# Patient Record
Sex: Female | Born: 1975 | Race: White | Hispanic: No | Marital: Married | Smoking: Never smoker
Health system: Southern US, Community
[De-identification: ages and names within clinical notes are randomized; demographics above are authoritative.]

## PROBLEM LIST (undated history)

## (undated) DIAGNOSIS — I10 Essential (primary) hypertension: Secondary | ICD-10-CM

## (undated) DIAGNOSIS — J302 Other seasonal allergic rhinitis: Secondary | ICD-10-CM

## (undated) DIAGNOSIS — G43909 Migraine, unspecified, not intractable, without status migrainosus: Secondary | ICD-10-CM

## (undated) DIAGNOSIS — N390 Urinary tract infection, site not specified: Secondary | ICD-10-CM

## (undated) DIAGNOSIS — Z8739 Personal history of other diseases of the musculoskeletal system and connective tissue: Secondary | ICD-10-CM

## (undated) HISTORY — PX: NO PAST SURGERIES: SHX2092

## (undated) HISTORY — PX: WISDOM TOOTH EXTRACTION: SHX21

## (undated) HISTORY — DX: Migraine, unspecified, not intractable, without status migrainosus: G43.909

## (undated) HISTORY — DX: Urinary tract infection, site not specified: N39.0

## (undated) HISTORY — DX: Essential (primary) hypertension: I10

## (undated) HISTORY — DX: Personal history of other diseases of the musculoskeletal system and connective tissue: Z87.39

---

## 2010-07-14 ENCOUNTER — Emergency Department: Payer: Self-pay | Admitting: Emergency Medicine

## 2013-03-23 ENCOUNTER — Ambulatory Visit (INDEPENDENT_AMBULATORY_CARE_PROVIDER_SITE_OTHER): Payer: BC Managed Care – PPO | Admitting: Adult Health

## 2013-03-23 ENCOUNTER — Telehealth: Payer: Self-pay | Admitting: Adult Health

## 2013-03-23 ENCOUNTER — Encounter: Payer: Self-pay | Admitting: Adult Health

## 2013-03-23 VITALS — BP 108/66 | HR 87 | Temp 98.0°F | Resp 12 | Ht 63.5 in | Wt 220.5 lb

## 2013-03-23 DIAGNOSIS — I499 Cardiac arrhythmia, unspecified: Secondary | ICD-10-CM | POA: Insufficient documentation

## 2013-03-23 LAB — CBC WITH DIFFERENTIAL/PLATELET
Basophils Absolute: 0 10*3/uL (ref 0.0–0.1)
Eosinophils Absolute: 0.1 10*3/uL (ref 0.0–0.7)
Eosinophils Relative: 1 % (ref 0–5)
Lymphocytes Relative: 18 % (ref 12–46)
MCH: 28.8 pg (ref 26.0–34.0)
MCV: 83.7 fL (ref 78.0–100.0)
Neutrophils Relative %: 76 % (ref 43–77)
Platelets: 327 10*3/uL (ref 150–400)
RDW: 14.5 % (ref 11.5–15.5)
WBC: 10.6 10*3/uL — ABNORMAL HIGH (ref 4.0–10.5)

## 2013-03-23 NOTE — Patient Instructions (Addendum)
  Please have your labs drawn prior to leaving.  We will notify you of results once available.  Our [facility] office will call you for the holter monitor.  Please call our office on Monday if you have not heard from them.

## 2013-03-23 NOTE — Progress Notes (Signed)
Subjective:    Patient ID: Kirsten Poole, female    DOB: 20-Apr-1976, 37 y.o.   MRN: [id number]  HPI  Kirsten Poole is a pleasant 37 y/o female who presents to clinic to establish care. She was previously followed by Yolanda Bonine, CNM. Pt will continue to see Melody for her GYN needs. Kirsten Poole is doing well overall. She is concerned because she has experienced recent episodes with extra heart beats. She noticed them on Monday in the morning and then again in the afternoon. On Tuesday she experienced possibly 8 episodes ~ every 30 mins. Occuring at the end of the day when she was busy trying to finish with the patients in the office. On Wednesday, first episode at 6:55 am, 9:25 am, 11:45 am, 3:20 pm, 3:57 pm, 4:10 pm and continued approximately every 30 min until about 5:30 pm. Then she had a few episodes during the night. On Thursday episodes were about every 1.5 hours to 2 hours and Friday (today) she has had 3 episodes today. There is no dizziness, lightheadedness, syncope or near syncope with any of these episodes. She drinks decaffeinated tea. There is no shortness of breath with any episode. Today she has felt a slight "pulling or tightness" in the chest on the left side above breast. She reports strenuous physical activity towards the end of last week with preparing for the installation of an above ground pool. She was shoveling and leveling the surface where the pool is being installed.   Past Medical History  Diagnosis Date  . Migraine    Surgical Hx: None  Family History  Problem Relation Age of Onset  . Diabetes Mother   . Hypertension Mother   . Thyroid disease Mother   . Thyroid disease Sister   . Thyroid disease Maternal Aunt   . Arthritis Maternal Grandmother   . Hyperlipidemia Maternal Grandmother   . Heart disease Maternal Grandmother   . Hypertension Maternal Grandmother   . Diabetes Maternal Grandmother   . Heart disease Maternal Grandfather     MI  . Hypertension Maternal  Grandfather   . Arthritis Paternal Grandmother      History   Social History  . Marital Status: Married    Spouse Name: [place] Garrelts    Number of Children: 3  . Years of Education: 14   Occupational History  . Office Manager     [employer]   Social History Main Topics  . Smoking status: Never Smoker   . Smokeless tobacco: Never Used  . Alcohol Use: No  . Drug Use: No  . Sexually Active: Yes    Birth Control/ Protection: IUD   Other Topics Concern  . Not on file   Social History Narrative   Kirsten Poole grew up in [place]. She lives at home with her husband of 15 years that their 3 children ([name], [name], [name]). They have a dog and cat. 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 She attended [employer] for her pre-requisites in nursing but realized this was not what she wanted to do. She is very busy with her 3 boys and baseball.       Review of Systems  Constitutional: Positive for fatigue. Negative for fever, chills and unexpected weight change.  HENT: Negative for ear pain, congestion, sore throat, neck pain, postnasal drip and sinus pressure.   Respiratory: Negative for wheezing.   Cardiovascular: Positive for palpitations.  Gastrointestinal: Positive for constipation. Negative for nausea, vomiting,  abdominal pain and blood in stool.  Genitourinary: Negative.   Musculoskeletal:       Slight discomfort with knees  Skin: Negative.   Allergic/Immunologic:       Season allergies - takes OTC medication.  Neurological: Positive for headaches. Negative for dizziness, tremors, seizures, syncope, weakness and light-headedness.  Hematological: Negative.   Psychiatric/Behavioral: Positive for decreased concentration. Negative for suicidal ideas, behavioral problems, confusion, sleep disturbance, self-injury and agitation. The patient is nervous/anxious.     BP 108/66  Pulse 87  Temp(Src) 98 F (36.7 C) (Oral)  Resp 12   Ht 5' 3.5" (1.613 m)  Wt 220 lb 8 oz (100.018 kg)  BMI 38.44 kg/m2  SpO2 98%    Objective:   Physical Exam  Constitutional: She is oriented to person, place, and time.  Overweight, pleasant female in NAD  HENT:  Head: Normocephalic and atraumatic.  Right Ear: External ear normal.  Left Ear: External ear normal.  Nose: Nose normal.  Mouth/Throat: Oropharynx is clear and moist.  Eyes: Conjunctivae and EOM are normal. Pupils are equal, round, and reactive to light.  Neck: Normal range of motion. Neck supple. No tracheal deviation present.  Cardiovascular: Normal rate, regular rhythm, normal heart sounds and intact distal pulses.  Exam reveals no gallop and no friction rub.   No murmur heard. Pulmonary/Chest: Effort normal and breath sounds normal. No respiratory distress. She has no wheezes. She has no rales.  Abdominal: Soft. Bowel sounds are normal. She exhibits no distension. There is no tenderness. There is no rebound and no guarding.  Musculoskeletal: Normal range of motion. She exhibits no edema and no tenderness.  Lymphadenopathy:    She has no cervical adenopathy.  Neurological: She is alert and oriented to person, place, and time. She has normal reflexes. No cranial nerve deficit. Coordination normal.  Skin: Skin is warm and dry.  Psychiatric: She has a normal mood and affect. Her behavior is normal. Judgment and thought content normal.          Assessment & Plan:

## 2013-03-23 NOTE — Telephone Encounter (Signed)
Left detailed mess on patients cell with all apt information for her holter monitor. [facility] in [place] ([address]) will place this on 7/14 @ 3pm. It will be removed at 3pm the very next day. I have provided the patient with the number to their office if this day does not work for her.

## 2013-03-23 NOTE — Assessment & Plan Note (Addendum)
Patient with frequent episodes of irregular heart beats. ?PVCs. Will check labs: cbc, tsh, bmet, magnesium.  ? Sleep apnea. Order holter monitor 24 hour. EKG today does not show incomplete RBBB as in 2011. No acute changes. If holter monitor wnl consider beta blocker for symptom control.

## 2013-03-24 LAB — BASIC METABOLIC PANEL
BUN: 13 mg/dL (ref 6–23)
Calcium: 9.1 mg/dL (ref 8.4–10.5)
Chloride: 104 mEq/L (ref 96–112)
Creat: 0.78 mg/dL (ref 0.50–1.10)

## 2013-03-24 LAB — TSH: TSH: 1.905 u[IU]/mL (ref 0.350–4.500)

## 2013-03-26 ENCOUNTER — Encounter (INDEPENDENT_AMBULATORY_CARE_PROVIDER_SITE_OTHER): Payer: BC Managed Care – PPO

## 2013-03-26 ENCOUNTER — Encounter: Payer: Self-pay | Admitting: Cardiology

## 2013-03-26 ENCOUNTER — Telehealth: Payer: Self-pay | Admitting: *Deleted

## 2013-03-26 DIAGNOSIS — R002 Palpitations: Secondary | ICD-10-CM

## 2013-03-26 NOTE — Telephone Encounter (Signed)
24 hr holter monitor placed on Pt 03/26/13 TK

## 2013-03-30 ENCOUNTER — Telehealth: Payer: Self-pay | Admitting: Adult Health

## 2013-03-30 NOTE — Telephone Encounter (Signed)
Holter monitor results

## 2013-03-30 NOTE — Telephone Encounter (Signed)
Left message for pt that I am in another office and Raquel is out of the office today, but that I would check for report on Monday and to call office if she has questions before then.

## 2013-04-02 ENCOUNTER — Telehealth: Payer: Self-pay | Admitting: Adult Health

## 2013-04-02 NOTE — Telephone Encounter (Signed)
Still having occasional symptoms, not worse than at office visit.

## 2013-04-02 NOTE — Telephone Encounter (Signed)
See other telephone note.  

## 2013-04-02 NOTE — Telephone Encounter (Signed)
No I have not seen the results yet. They are reviewed and interpreted outside of the office and it takes a while. We will call her once results are available. Is she still having symptoms?

## 2013-04-02 NOTE — Telephone Encounter (Signed)
Have you seen these results?

## 2013-04-02 NOTE — Telephone Encounter (Signed)
Asking for results of Holter monitor from last Monday.

## 2013-04-02 NOTE — Telephone Encounter (Signed)
Pt.notified

## 2013-04-09 ENCOUNTER — Telehealth: Payer: Self-pay | Admitting: Adult Health

## 2013-04-09 NOTE — Telephone Encounter (Signed)
Have you seen these results?

## 2013-04-09 NOTE — Telephone Encounter (Signed)
I have not seen these results. The strips are sent for evaluation. It takes a while to review. Once we get the results we will contact patient.

## 2013-04-09 NOTE — Telephone Encounter (Signed)
Patient wanting result from Holter monitor.

## 2013-04-10 NOTE — Telephone Encounter (Signed)
See my Chart message.

## 2013-04-17 ENCOUNTER — Encounter: Payer: Self-pay | Admitting: *Deleted

## 2013-04-17 ENCOUNTER — Telehealth: Payer: Self-pay | Admitting: Adult Health

## 2013-04-17 NOTE — Telephone Encounter (Signed)
Results are in Epic, under CV Procedures

## 2013-04-17 NOTE — Telephone Encounter (Signed)
I haven't seen these yet. Kriste Basque could you call the cardiology office and inquire about this?

## 2013-04-17 NOTE — Telephone Encounter (Signed)
Sent myChart message with results 

## 2013-04-17 NOTE — Telephone Encounter (Signed)
Pt calling to check on her results for her holter monitor.  Pt stated she had this done about 3 weeks ago @ [facility] in [place] Pt is getting anxious for results

## 2013-04-17 NOTE — Telephone Encounter (Signed)
Normal holter monitor. Please let her know that results were scanned into our system yesterday afternoon. These take a while to be read.

## 2013-07-19 ENCOUNTER — Other Ambulatory Visit: Payer: Self-pay

## 2013-08-30 ENCOUNTER — Ambulatory Visit: Payer: BC Managed Care – PPO | Admitting: Internal Medicine

## 2013-09-04 ENCOUNTER — Encounter: Payer: Self-pay | Admitting: Adult Health

## 2013-09-04 ENCOUNTER — Ambulatory Visit (INDEPENDENT_AMBULATORY_CARE_PROVIDER_SITE_OTHER): Payer: BC Managed Care – PPO | Admitting: Adult Health

## 2013-09-04 VITALS — BP 122/80 | HR 85 | Temp 98.3°F | Wt 226.0 lb

## 2013-09-04 DIAGNOSIS — J40 Bronchitis, not specified as acute or chronic: Secondary | ICD-10-CM | POA: Insufficient documentation

## 2013-09-04 MED ORDER — FLUTICASONE PROPIONATE 50 MCG/ACT NA SUSP: 2.0000 | Freq: Every day | NASAL | Status: DC

## 2013-09-04 MED ORDER — AZITHROMYCIN 250 MG PO TABS: ORAL_TABLET | ORAL | Status: DC

## 2013-09-04 NOTE — Patient Instructions (Signed)

## 2013-09-04 NOTE — Progress Notes (Signed)
   Subjective:    Patient ID: Kirsten Poole, female    DOB: 09-Feb-1976, 37 y.o.   MRN: [id number]  HPI  Kirsten Poole presents to clinic with c/o cough. She reports that approximately 4 weeks ago she started to have some sinus congestion, drainage. She denied every having a fever. Cough will sometimes wake her. She has not taken any OTC meds.   Current Outpatient Prescriptions on File Prior to Visit  Medication Sig Dispense Refill  . cyanocobalamin 500 MCG tablet Take 500 mcg by mouth daily.      . fish oil-omega-3 fatty acids 1000 MG capsule Take 1 g by mouth daily.      Marland Kitchen levonorgestrel (MIRENA) 20 MCG/24HR IUD 1 each by Intrauterine route once.       No current facility-administered medications on file prior to visit.     Review of Systems  Constitutional: Negative.   HENT: Positive for congestion, postnasal drip and sinus pressure.   Respiratory: Positive for cough. Negative for shortness of breath and wheezing.   Cardiovascular: Negative.   Neurological: Negative.   Psychiatric/Behavioral: Negative.        Objective:   Physical Exam  Constitutional: She is oriented to person, place, and time. No distress.  HENT:  Pharyngeal erythema. Drainage noted posterior pharynx.  Neck: No tracheal deviation present.  Cardiovascular: Normal rate, regular rhythm and normal heart sounds.   Pulmonary/Chest: Effort normal and breath sounds normal. No respiratory distress. She has no wheezes. She has no rales.  Lymphadenopathy:    She has no cervical adenopathy.  Neurological: She is alert and oriented to person, place, and time.  Skin: Skin is warm and dry.  Psychiatric: She has a normal mood and affect. Her behavior is normal. Judgment and thought content normal.   BP 122/80  Pulse 85  Temp(Src) 98.3 F (36.8 C) (Oral)  Wt 226 lb (102.513 kg)  SpO2 96%        Assessment & Plan:

## 2013-09-04 NOTE — Assessment & Plan Note (Signed)
Start Azithromycin. Flonase nasal spray. RTC if symptoms not improved within 4-5 days.

## 2013-09-04 NOTE — Progress Notes (Signed)
Pre visit review using our clinic review tool, if applicable. No additional management support is needed unless otherwise documented below in the visit note. 

## 2014-09-27 LAB — HM PAP SMEAR: HM Pap smear: NEGATIVE

## 2015-05-16 DIAGNOSIS — I8390 Asymptomatic varicose veins of unspecified lower extremity: Secondary | ICD-10-CM | POA: Insufficient documentation

## 2015-09-22 ENCOUNTER — Encounter: Payer: Self-pay | Admitting: *Deleted

## 2015-09-30 ENCOUNTER — Encounter: Payer: Self-pay | Admitting: Obstetrics and Gynecology

## 2015-12-23 ENCOUNTER — Encounter: Payer: Self-pay | Admitting: Obstetrics and Gynecology

## 2015-12-23 ENCOUNTER — Ambulatory Visit (INDEPENDENT_AMBULATORY_CARE_PROVIDER_SITE_OTHER): Payer: BC Managed Care – PPO | Admitting: Obstetrics and Gynecology

## 2015-12-23 VITALS — BP 112/82 | HR 97 | Ht 63.0 in | Wt 235.8 lb

## 2015-12-23 DIAGNOSIS — Z01419 Encounter for gynecological examination (general) (routine) without abnormal findings: Secondary | ICD-10-CM | POA: Diagnosis not present

## 2015-12-23 NOTE — Progress Notes (Signed)
Subjective:   Kirsten CedarsKelly B Petillo is a 40 y.o. No obstetric history on file. Caucasian female here for a routine well-woman exam.  No LMP recorded. Patient is not currently having periods (Reason: IUD).    Current complaints: desires restart weight loss meds, and needs IUD changed out. PCP: Rey       does desire labs  Social History: Sexual: heterosexual Marital Status: married Living situation: with family Occupation: unknown occupation Tobacco/alcohol: no tobacco use Illicit drugs: no history of illicit drug use  The following portions of the patient's history were reviewed and updated as appropriate: allergies, current medications, past family history, past medical history, past social history, past surgical history and problem list.  Past Medical History Past Medical History  Diagnosis Date  . Migraine   . UTI (lower urinary tract infection)     Past Surgical History History reviewed. No pertinent past surgical history.  Gynecologic History No obstetric history on file.  No LMP recorded. Patient is not currently having periods (Reason: IUD). Contraception: IUD Last Pap: 2015. Results were: normal   Obstetric History OB History  No data available    Current Medications Current Outpatient Prescriptions on File Prior to Visit  Medication Sig Dispense Refill  . azithromycin (ZITHROMAX) 250 MG tablet Take 2 tablets today then take 1 daily for the next 4 days. 6 tablet 0  . fluticasone (FLONASE) 50 MCG/ACT nasal spray Place 2 sprays into both nostrils daily. 16 g 6  . levonorgestrel (MIRENA) 20 MCG/24HR IUD 1 each by Intrauterine route once.    . cyanocobalamin 500 MCG tablet Take 500 mcg by mouth daily. Reported on 12/23/2015    . fish oil-omega-3 fatty acids 1000 MG capsule Take 1 g by mouth daily. Reported on 12/23/2015     No current facility-administered medications on file prior to visit.    Review of Systems Patient denies any headaches, blurred vision, shortness  of breath, chest pain, abdominal pain, problems with bowel movements, urination, or intercourse.  Objective:  BP 112/82 mmHg  Pulse 97  Ht 5\' 3"  (1.6 m)  Wt 235 lb 12.8 oz (106.958 kg)  BMI 41.78 kg/m2 Physical Exam  General:  Well developed, well nourished, no acute distress. She is alert and oriented x3. Skin:  Warm and dry Neck:  Midline trachea, no thyromegaly or nodules Cardiovascular: Regular rate and rhythm, no murmur heard Lungs:  Effort normal, all lung fields clear to auscultation bilaterally Breasts:  No dominant palpable mass, retraction, or nipple discharge Abdomen:  Soft, non tender, no hepatosplenomegaly or masses Pelvic:  External genitalia is normal in appearance.  The vagina is normal in appearance. The cervix is bulbous, no CMT. IUD string palpated. Thin prep pap is not done . Uterus is felt to be normal size, shape, and contour.  No adnexal masses or tenderness noted. Extremities:  No swelling or varicosities noted Psych:  She has a normal mood and affect  Assessment:   Healthy well-woman exam Morbid obesity Mirena use  Plan:  Return in 2 days to change out IUD and start weight loss plan F/U 1 year for AE, or sooner if needed   Melody Suzan NailerN Shambley, CNM

## 2015-12-23 NOTE — Patient Instructions (Signed)
Place annual gynecologic exam patient instructions here.

## 2015-12-24 LAB — COMPREHENSIVE METABOLIC PANEL
ALBUMIN: 4.1 g/dL (ref 3.5–5.5)
ALT: 12 IU/L (ref 0–32)
AST: 12 IU/L (ref 0–40)
Albumin/Globulin Ratio: 1.6 (ref 1.2–2.2)
Alkaline Phosphatase: 71 IU/L (ref 39–117)
BILIRUBIN TOTAL: 0.3 mg/dL (ref 0.0–1.2)
BUN / CREAT RATIO: 18 (ref 9–23)
BUN: 14 mg/dL (ref 6–20)
CO2: 18 mmol/L (ref 18–29)
CREATININE: 0.78 mg/dL (ref 0.57–1.00)
Calcium: 9.5 mg/dL (ref 8.7–10.2)
Chloride: 102 mmol/L (ref 96–106)
GFR, EST AFRICAN AMERICAN: 112 mL/min/{1.73_m2} (ref >59)
GFR, EST NON AFRICAN AMERICAN: 97 mL/min/{1.73_m2} (ref >59)
GLUCOSE: 101 mg/dL — AB (ref 65–99)
Globulin, Total: 2.6 g/dL (ref 1.5–4.5)
Potassium: 4.4 mmol/L (ref 3.5–5.2)
Sodium: 138 mmol/L (ref 134–144)
TOTAL PROTEIN: 6.7 g/dL (ref 6.0–8.5)

## 2015-12-24 LAB — LIPID PANEL
CHOL/HDL RATIO: 3.5 ratio (ref 0.0–4.4)
Cholesterol, Total: 149 mg/dL (ref 100–199)
HDL: 43 mg/dL (ref >39)
LDL CALC: 81 mg/dL (ref 0–99)
Triglycerides: 124 mg/dL (ref 0–149)
VLDL CHOLESTEROL CAL: 25 mg/dL (ref 5–40)

## 2015-12-24 LAB — THYROID PANEL WITH TSH
Free Thyroxine Index: 1.7 (ref 1.2–4.9)
T3 Uptake Ratio: 25 % (ref 24–39)
T4, Total: 6.7 ug/dL (ref 4.5–12.0)
TSH: 3.24 u[IU]/mL (ref 0.450–4.500)

## 2015-12-24 LAB — HEMOGLOBIN A1C
ESTIMATED AVERAGE GLUCOSE: 114 mg/dL
HEMOGLOBIN A1C: 5.6 % (ref 4.8–5.6)

## 2015-12-25 ENCOUNTER — Encounter: Payer: Self-pay | Admitting: Obstetrics and Gynecology

## 2015-12-25 ENCOUNTER — Ambulatory Visit (INDEPENDENT_AMBULATORY_CARE_PROVIDER_SITE_OTHER): Payer: BC Managed Care – PPO | Admitting: Obstetrics and Gynecology

## 2015-12-25 VITALS — BP 120/78 | HR 98 | Ht 63.0 in | Wt 236.0 lb

## 2015-12-25 DIAGNOSIS — E669 Obesity, unspecified: Secondary | ICD-10-CM | POA: Diagnosis not present

## 2015-12-25 DIAGNOSIS — Z30433 Encounter for removal and reinsertion of intrauterine contraceptive device: Secondary | ICD-10-CM | POA: Diagnosis not present

## 2015-12-25 MED ORDER — CYANOCOBALAMIN 1000 MCG/ML IJ SOLN: 1000.0000 ug | Freq: Once | INTRAMUSCULAR | Status: DC

## 2015-12-25 MED ORDER — PHENTERMINE HCL 37.5 MG PO TABS: 37.5000 mg | ORAL_TABLET | Freq: Every day | ORAL | Status: DC

## 2015-12-25 NOTE — Patient Instructions (Signed)
Thank you for enrolling in MyChart. Please follow the instructions below to securely access your online medical record. MyChart allows you to send messages to your doctor, view your test results, renew your prescriptions, schedule appointments, and more.  How Do I Sign Up? 1. In your Internet browser, go to [email or web address]. 2. Click on the New  User? link in the Sign In box.  3. Enter your MyChart Access Code exactly as it appears below. You will not need to use this code after you have completed the sign-up process. If you do not sign up before the expiration date, you must request a new code. MyChart Access Code: Activation code not generated Current MyChart Status: Active  4. Enter the last four digits of your Social Security Number (xxxx) and Date of Birth (mm/dd/yyyy) as indicated and click Next. You will be taken to the next sign-up page. 5. Create a MyChart ID. This will be your MyChart login ID and cannot be changed, so think of one that is secure and easy to remember. 6. Create a MyChart password. You can change your password at any time. 7. Enter your Password Reset Question and Answer and click Next. This can be used at a later time if you forget your password.  8. Select your communication preference, and if applicable enter your e-mail address. You will receive e-mail notification when new information is available in MyChart by choosing to receive e-mail notifications and filling in your e-mail. 9. Click Sign In. You can now view your medical record.   Additional Information If you have questions, you can email [email or web address] or call [phone] to talk to our MyChart staff. Remember, MyChart is NOT to be used for urgent needs. For medical emergencies, dial 911. IUD PLACEMENT POST-PROCEDURE INSTRUCTIONS  1. You may take Ibuprofen, Aleve or Tylenol for pain if needed.  Cramping should resolve within in 24 hours.  2. You may have a small  amount of spotting.  You should wear a mini pad for the next few days.  3. You may have intercourse after 24 hours.  If you using this for birth control, it is effective immediately.  4. You need to call if you have any pelvic pain, fever, heavy bleeding or foul smelling vaginal discharge.  Irregular bleeding is common the first several months after having an IUD placed. You do not need to call for this reason unless you are concerned.  5. Shower or bathe as normal

## 2015-12-25 NOTE — Progress Notes (Signed)
Kirsten Poole is a 40 y.o. year old 773P0 Caucasian female who presents for removal and replacement of a Mirena IUD. She was given informed consent for removal and reinsertion of her Mirena. Her Mirena was placed 2011, No LMP recorded. Patient is not currently having periods (Reason: IUD)., and her pregnancy test today was negative.   The risks and benefits of the method and placement have been thouroughly reviewed with the patient and all questions were answered.  Specifically the patient is aware of failure rate of 09/998, expulsion of the IUD and of possible perforation.  The patient is aware of irregular bleeding due to the method and understands the incidence of irregular bleeding diminishes with time.  Signed copy of informed consent in chart.   No LMP recorded. Patient is not currently having periods (Reason: IUD). BP 120/78 mmHg  Pulse 98  Ht 5\' 3"  (1.6 m)  Wt 236 lb (107.049 kg)  BMI 41.82 kg/m2 No results found for this or any previous visit (from the past 24 hour(s)).   Appropriate time out taken. A graves speculum was placed in the vagina.  The cervix was visualized, prepped using Betadine. The strings were visible. They were grasped and the Mirena was easily removed. The cervix was then grasped with a single-tooth tenaculum. The uterus was found to be neutral and it sounded to 9 cm.  Mirena IUD placed per manufacturer's recommendations without complications. The strings were trimmed to 3 cm.  The patient tolerated the procedure well.  B12 1000mcg injection was given to start weight loss program along with RX for adipex  The patient was given post procedure instructions, including signs and symptoms of infection and to check for the strings after each menses or each month, and refraining from intercourse or anything in the vagina for 3 days.  She was given a Mirena care card with date Mirena placed, and date Mirena to be removed.  RTC 4 weeks for wt/bp/b12 as discussed.  Melody Suzan NailerN  Shambley, CNM

## 2016-01-23 ENCOUNTER — Ambulatory Visit (INDEPENDENT_AMBULATORY_CARE_PROVIDER_SITE_OTHER): Payer: BC Managed Care – PPO | Admitting: Obstetrics and Gynecology

## 2016-01-23 VITALS — BP 115/87 | HR 73 | Wt 233.0 lb

## 2016-01-23 DIAGNOSIS — E669 Obesity, unspecified: Secondary | ICD-10-CM

## 2016-01-23 MED ORDER — CYANOCOBALAMIN 1000 MCG/ML IJ SOLN
1000.0000 ug | Freq: Once | INTRAMUSCULAR | Status: AC
  Administered 2016-01-23: 1000 ug via INTRAMUSCULAR

## 2016-01-23 NOTE — Progress Notes (Signed)
Patient ID: Kirsten CedarsKelly B Bolding, female   DOB: 01/05/1976, 40 y.o.   MRN: [id number] Pt presents for weight, B/P, B-12 injection. No side effects of medication-Phentermine, or B-12.  Weight loss of  3  lbs. Encouraged eating healthy and exercise.

## 2016-03-02 ENCOUNTER — Encounter: Payer: Self-pay | Admitting: Obstetrics and Gynecology

## 2016-06-03 ENCOUNTER — Ambulatory Visit (INDEPENDENT_AMBULATORY_CARE_PROVIDER_SITE_OTHER): Payer: BC Managed Care – PPO | Admitting: Obstetrics and Gynecology

## 2016-06-03 ENCOUNTER — Encounter: Payer: Self-pay | Admitting: Obstetrics and Gynecology

## 2016-06-03 VITALS — BP 118/86 | HR 84 | Ht 63.0 in | Wt 223.1 lb

## 2016-06-03 DIAGNOSIS — E669 Obesity, unspecified: Secondary | ICD-10-CM | POA: Diagnosis not present

## 2016-06-03 DIAGNOSIS — Z79899 Other long term (current) drug therapy: Secondary | ICD-10-CM

## 2016-06-03 MED ORDER — PHENTERMINE HCL 37.5 MG PO TABS: 37.5000 mg | ORAL_TABLET | Freq: Every day | ORAL | 2 refills | Status: DC

## 2016-06-03 MED ORDER — CYANOCOBALAMIN 1000 MCG/ML IJ SOLN: 1000.0000 ug | INTRAMUSCULAR | 1 refills | Status: DC

## 2016-06-03 NOTE — Progress Notes (Signed)
SUBJECTIVE:  40 y.o. here for follow-up weight loss visit, previously seen 4 weeks ago. Denies any concerns and feels like medication works well. Has been off of the medication for 3 weeks. Has lost 12# total. Is walking 20 minutes three times a week. Desires another 3 months of medication to continue losing weight.  OBJECTIVE:  BP 118/86   Pulse 84   Ht 5\' 3"  (1.6 m)   Wt 223 lb 1.6 oz (101.2 kg)   BMI 39.52 kg/m   Body mass index is 39.52 kg/m. Patient appears well.  ASSESSMENT:  Obesity- responding well to weight loss plan PLAN:  To continue with current medications. B12 102400mcg/ml injection given. Encouraged to increase exercise to 4-5 days and walking longer.  RTC in 14 weeks as planned  Coner Gibbard South[place]Shambley, CNM

## 2016-09-09 ENCOUNTER — Ambulatory Visit: Payer: BC Managed Care – PPO | Admitting: Obstetrics and Gynecology

## 2017-10-19 ENCOUNTER — Encounter: Payer: Self-pay | Admitting: Emergency Medicine

## 2017-10-19 ENCOUNTER — Ambulatory Visit
Admission: EM | Admit: 2017-10-19 | Discharge: 2017-10-19 | Disposition: A | Discharge status: Home / Self Care | Payer: BC Managed Care – PPO | Attending: Family Medicine | Admitting: Family Medicine

## 2017-10-19 ENCOUNTER — Other Ambulatory Visit: Payer: Self-pay

## 2017-10-19 DIAGNOSIS — H5710 Ocular pain, unspecified eye: Secondary | ICD-10-CM

## 2017-10-19 DIAGNOSIS — H5702 Anisocoria: Secondary | ICD-10-CM

## 2017-10-19 NOTE — Discharge Instructions (Signed)
Recommend patient go to her Eye doctor today for further evaluation of acute eye changes (pain, redness, change in pupil size)

## 2017-10-19 NOTE — ED Triage Notes (Signed)
Patient c/o watery drainage and sensitivity to light that started yesterday.  Patient states that she does wear contacts.

## 2017-10-19 NOTE — ED Provider Notes (Signed)
[facility]    CSN: [id number] Arrival date & time: 10/19/17  4098     History   Chief Complaint Chief Complaint  Patient presents with  . Eye Problem    HPI Kirsten Poole is a 42 y.o. female.   42 yo female with a c/o right eye pain, redness, and watery since yesterday. States she's been wearing monthly contact lenses prescribed last month and keeps them in overnight. Denies any thick, yellow drainage from the eye, direct traumatic injury, vision changes. States she's not aware of any differences in her pupils. (states has never been told her pupils are different size).    The history is provided by the patient.  Eye Problem    Past Medical History:  Diagnosis Date  . Migraine   . UTI (lower urinary tract infection)     Patient Active Problem List   Diagnosis Date Noted  . Bronchitis 09/04/2013  . Irregular heart beats 03/23/2013    History reviewed. No pertinent surgical history.  OB History    Gravida Para Term Preterm AB Living   3         3   SAB TAB Ectopic Multiple Live Births           3       Home Medications    Prior to Admission medications   Medication Sig Start Date End Date Taking? Authorizing Provider  fish oil-omega-3 fatty acids 1000 MG capsule Take 1 g by mouth daily. Reported on 12/23/2015   Yes [provider]  levonorgestrel (MIRENA) 20 MCG/24HR IUD 1 each by Intrauterine route once.   Yes [provider]  azithromycin (ZITHROMAX) 250 MG tablet Take 2 tablets today then take 1 daily for the next 4 days. Patient not taking: Reported on 06/03/2016 09/04/13   Novella Olive, NP  cetirizine (ZYRTEC ALLERGY) 10 MG tablet Take 10 mg by mouth daily.    [provider]  cyanocobalamin (,VITAMIN B-12,) 1000 MCG/ML injection Inject 1 mL (1,000 mcg total) into the muscle every 30 (thirty) days. 06/03/16   Shambley, Melody N, CNM  cyanocobalamin 500 MCG tablet Take 500 mcg by mouth daily. Reported on 12/23/2015     [provider]  fluticasone (FLONASE) 50 MCG/ACT nasal spray Place 2 sprays into both nostrils daily. 09/04/13   Rey, Richarda Overlie, NP  phentermine (ADIPEX-P) 37.5 MG tablet Take 1 tablet (37.5 mg total) by mouth daily before breakfast. 06/03/16   Aura Camps, Melody N, CNM    Family History Family History  Problem Relation Age of Onset  . Diabetes Mother   . Hypertension Mother   . Thyroid disease Mother   . Thyroid disease Sister   . Thyroid disease Maternal Aunt   . Arthritis Maternal Grandmother   . Hyperlipidemia Maternal Grandmother   . Heart disease Maternal Grandmother   . Hypertension Maternal Grandmother   . Diabetes Maternal Grandmother   . Heart disease Maternal Grandfather        MI  . Hypertension Maternal Grandfather   . Arthritis Paternal Grandmother     Social History Social History   Tobacco Use  . Smoking status: Never Smoker  . Smokeless tobacco: Never Used  Substance Use Topics  . Alcohol use: No  . Drug use: No     Allergies   Other   Review of Systems Review of Systems   Physical Exam Triage Vital Signs ED Triage Vitals  Enc Vitals Group     BP  10/19/17 0820 135/84     Pulse Rate 10/19/17 0820 74     Resp 10/19/17 0820 16     Temp 10/19/17 0820 97.9 F (36.6 C)     Temp Source 10/19/17 0820 Oral     SpO2 10/19/17 0820 99 %     Weight 10/19/17 0817 235 lb (106.6 kg)     Height 10/19/17 0817 5\' 3"  (1.6 m)     Head Circumference --      Peak Flow --      Pain Score 10/19/17 0817 0     Pain Loc --      Pain Edu? --      Excl. in GC? --    No data found.  Updated Vital Signs BP 135/84 (BP Location: Left Arm)   Pulse 74   Temp 97.9 F (36.6 C) (Oral)   Resp 16   Ht 5\' 3"  (1.6 m)   Wt 235 lb (106.6 kg)   SpO2 99%   BMI 41.63 kg/m   Visual Acuity Right Eye Distance: 20/100 uncorrected Left Eye Distance: 20/100 uncorrected Bilateral Distance:    Right Eye Near:   Left Eye Near:    Bilateral Near:     Physical Exam   Constitutional: She appears well-developed and well-nourished. No distress.  Eyes: EOM are normal. Right conjunctiva is injected. Pupils are unequal (right pupil smaller than left).  Skin: She is not diaphoretic.  Nursing note and vitals reviewed.    UC Treatments / Results  Labs (all labs ordered are listed, but only abnormal results are displayed) Labs Reviewed - No data to display  EKG  EKG Interpretation None       Radiology No results found.  Procedures Procedures (including critical care time)  Medications Ordered in UC Medications - No data to display   Initial Impression / Assessment and Plan / UC Course  I have reviewed the triage vital signs and the nursing notes.  Pertinent labs & imaging results that were available during my care of the patient were reviewed by me and considered in my medical decision making (see chart for details).       Final Clinical Impressions(s) / UC Diagnoses   Final diagnoses:  Acute eye pain  Unequal pupils    ED Discharge Orders    None     1. Possible diagnosis reviewed with patient; recommend patient be seen by her eye doctor today for further evaluation and management. Patient will proceed to [facility] where she's a patient and was seen last one month ago.   Controlled Substance Prescriptions [place] Controlled Substance Registry consulted? Not Applicable   Payton Mccallumonty, Vaibhav Fogleman, MD 10/19/17 [phone]

## 2018-03-01 ENCOUNTER — Other Ambulatory Visit: Payer: Self-pay | Admitting: Orthopedic Surgery

## 2018-03-01 DIAGNOSIS — M25561 Pain in right knee: Secondary | ICD-10-CM

## 2018-04-07 ENCOUNTER — Ambulatory Visit
Admission: RE | Admit: 2018-04-07 | Discharge: 2018-04-07 | Disposition: A | Discharge status: Home / Self Care | Payer: BC Managed Care – PPO | Source: Ambulatory Visit | Attending: Orthopedic Surgery | Admitting: Orthopedic Surgery

## 2018-04-07 DIAGNOSIS — M25561 Pain in right knee: Secondary | ICD-10-CM | POA: Diagnosis not present

## 2018-04-07 DIAGNOSIS — M1711 Unilateral primary osteoarthritis, right knee: Secondary | ICD-10-CM | POA: Diagnosis not present

## 2020-10-19 ENCOUNTER — Other Ambulatory Visit: Payer: Self-pay

## 2020-10-19 ENCOUNTER — Ambulatory Visit (INDEPENDENT_AMBULATORY_CARE_PROVIDER_SITE_OTHER): Payer: BC Managed Care – PPO

## 2020-10-19 ENCOUNTER — Ambulatory Visit
Admission: RE | Admit: 2020-10-19 | Discharge: 2020-10-19 | Disposition: A | Discharge status: Home / Self Care | Payer: BC Managed Care – PPO | Source: Ambulatory Visit | Attending: Sports Medicine | Admitting: Sports Medicine

## 2020-10-19 VITALS — BP 141/82 | HR 88 | Temp 97.8°F | Resp 18 | Ht 63.0 in | Wt 230.0 lb

## 2020-10-19 DIAGNOSIS — R630 Anorexia: Secondary | ICD-10-CM | POA: Diagnosis not present

## 2020-10-19 DIAGNOSIS — R109 Unspecified abdominal pain: Secondary | ICD-10-CM | POA: Diagnosis not present

## 2020-10-19 DIAGNOSIS — R1030 Lower abdominal pain, unspecified: Secondary | ICD-10-CM | POA: Insufficient documentation

## 2020-10-19 DIAGNOSIS — R197 Diarrhea, unspecified: Secondary | ICD-10-CM | POA: Insufficient documentation

## 2020-10-19 DIAGNOSIS — K921 Melena: Secondary | ICD-10-CM | POA: Insufficient documentation

## 2020-10-19 DIAGNOSIS — Z20822 Contact with and (suspected) exposure to covid-19: Secondary | ICD-10-CM | POA: Diagnosis not present

## 2020-10-19 HISTORY — DX: Other seasonal allergic rhinitis: J30.2

## 2020-10-19 LAB — CBC WITH DIFFERENTIAL/PLATELET
Abs Immature Granulocytes: 0.03 10*3/uL (ref 0.00–0.07)
Basophils Absolute: 0 10*3/uL (ref 0.0–0.1)
Basophils Relative: 0 %
Eosinophils Absolute: 0.2 10*3/uL (ref 0.0–0.5)
Eosinophils Relative: 2 %
HCT: 41.3 % (ref 36.0–46.0)
Hemoglobin: 13.7 g/dL (ref 12.0–15.0)
Immature Granulocytes: 0 %
Lymphocytes Relative: 16 %
Lymphs Abs: 1.6 10*3/uL (ref 0.7–4.0)
MCH: 28.2 pg (ref 26.0–34.0)
MCHC: 33.2 g/dL (ref 30.0–36.0)
MCV: 85.2 fL (ref 80.0–100.0)
Monocytes Absolute: 0.5 10*3/uL (ref 0.1–1.0)
Monocytes Relative: 5 %
Neutro Abs: 7.6 10*3/uL (ref 1.7–7.7)
Neutrophils Relative %: 77 %
Platelets: 373 10*3/uL (ref 150–400)
RBC: 4.85 MIL/uL (ref 3.87–5.11)
RDW: 13.6 % (ref 11.5–15.5)
WBC: 10 10*3/uL (ref 4.0–10.5)
nRBC: 0 % (ref 0.0–0.2)

## 2020-10-19 LAB — COMPREHENSIVE METABOLIC PANEL
ALT: 15 U/L (ref 0–44)
AST: 17 U/L (ref 15–41)
Albumin: 3.8 g/dL (ref 3.5–5.0)
Alkaline Phosphatase: 59 U/L (ref 38–126)
Anion gap: 12 (ref 5–15)
BUN: 9 mg/dL (ref 6–20)
CO2: 23 mmol/L (ref 22–32)
Calcium: 8.9 mg/dL (ref 8.9–10.3)
Chloride: 104 mmol/L (ref 98–111)
Creatinine, Ser: 0.81 mg/dL (ref 0.44–1.00)
GFR, Estimated: >60 mL/min (ref >60)
Glucose, Bld: 98 mg/dL (ref 70–99)
Potassium: 3.3 mmol/L — ABNORMAL LOW (ref 3.5–5.1)
Sodium: 139 mmol/L (ref 135–145)
Total Bilirubin: 0.7 mg/dL (ref 0.3–1.2)
Total Protein: 7.4 g/dL (ref 6.5–8.1)

## 2020-10-19 LAB — URINALYSIS, COMPLETE (UACMP) WITH MICROSCOPIC
Glucose, UA: NEGATIVE mg/dL
Ketones, ur: >160 mg/dL — AB
Leukocytes,Ua: NEGATIVE
Nitrite: NEGATIVE
Protein, ur: 30 mg/dL — AB
Specific Gravity, Urine: >1.03 — ABNORMAL HIGH (ref 1.005–1.030)
pH: 5.5 (ref 5.0–8.0)

## 2020-10-19 LAB — LIPASE, BLOOD: Lipase: 21 U/L (ref 11–51)

## 2020-10-19 MED ORDER — KETOROLAC TROMETHAMINE 10 MG PO TABS: 10.0000 mg | ORAL_TABLET | Freq: Four times a day (QID) | ORAL | 0 refills | Status: DC | PRN

## 2020-10-19 MED ORDER — KETOROLAC TROMETHAMINE 30 MG/ML IJ SOLN
30.0000 mg | Freq: Once | INTRAMUSCULAR | Status: AC
  Administered 2020-10-19: 30 mg via INTRAVENOUS

## 2020-10-19 MED ORDER — SODIUM CHLORIDE 0.9 % IV BOLUS
1000.0000 mL | Freq: Once | INTRAVENOUS | Status: AC
  Administered 2020-10-19: 1000 mL via INTRAVENOUS

## 2020-10-19 MED ORDER — ONDANSETRON 8 MG PO TBDP: 8.0000 mg | ORAL_TABLET | Freq: Once | ORAL | Status: DC

## 2020-10-19 MED ORDER — KETOROLAC TROMETHAMINE 10 MG PO TABS: 10.0000 mg | ORAL_TABLET | Freq: Four times a day (QID) | ORAL | 0 refills | Status: AC | PRN

## 2020-10-19 MED ORDER — KETOROLAC TROMETHAMINE 60 MG/2ML IM SOLN: 60.0000 mg | Freq: Once | INTRAMUSCULAR | Status: DC

## 2020-10-19 NOTE — ED Triage Notes (Addendum)
Patient in today c/o abdominal pain and diarrhea x 4 days, getting worse. Patient denies fever. Patient has taken OTC Imodium (1 dose per day) without relief. Patient has not had the covid vaccines. Patient declines covid testing at this time.

## 2020-10-19 NOTE — Discharge Instructions (Addendum)
Please return with stool samples as soon as possible. We close at 4 today but are open 8-8 tomorrow. It is possible you could have C-diff. If you do we will need to treat with antibiotics. Right now, increase rest and fluids. Go to ED for worsening symptoms.  ABDOMINAL PAIN: You may take Tylenol for pain relief. Use medications as directed including antiemetics and antidiarrheal medications if suggested or prescribed. You should increase fluids and electrolytes as well as rest over these next several days. If you have any questions or concerns, or if your symptoms are not improving or if especially if they acutely worsen, please call or stop back to the clinic immediately and we will be happy to help you or go to the ER   ABDOMINAL PAIN RED FLAGS: Seek immediate further care if: symptoms remain the same or worsen over the next 3-7 days, you are unable to keep fluids down, you see blood or mucus in your stool, you vomit black or dark red material, you have a fever of 101.F or higher, you have localized and/or persistent abdominal pain    You have received COVID testing today either for positive exposure, concerning symptoms that could be related to COVID infection, screening purposes, or re-testing after confirmed positive.  Your test obtained today checks for active viral infection in the last 1-2 weeks. If your test is negative now, you can still test positive later. So, if you do develop symptoms you should either get re-tested and/or isolate x 5 days and then strict mask use x 5 days (unvaccinated) or mask use x 10 days (vaccinated). Please follow CDC guidelines.  While Rapid antigen tests come back in 15-20 minutes, send out PCR/molecular test results typically come back within 1-3 days. In the mean time, if you are symptomatic, assume this could be a positive test and treat/monitor yourself as if you do have COVID.   We will call with test results if positive. Please download the MyChart app and set  up a profile to access test results.   If symptomatic, go home and rest. Push fluids. Take Tylenol as needed for discomfort. Gargle warm salt water. Throat lozenges. Take Mucinex DM or Robitussin for cough. Humidifier in bedroom to ease coughing. Warm showers. Also review the COVID handout for more information.  COVID-19 INFECTION: The incubation period of COVID-19 is approximately 14 days after exposure, with most symptoms developing in roughly 4-5 days. Symptoms may range in severity from mild to critically severe. Roughly 80% of those infected will have mild symptoms. People of any age may become infected with COVID-19 and have the ability to transmit the virus. The most common symptoms include: fever, fatigue, cough, body aches, headaches, sore throat, nasal congestion, shortness of breath, nausea, vomiting, diarrhea, changes in smell and/or taste.    COURSE OF ILLNESS Some patients may begin with mild disease which can progress quickly into critical symptoms. If your symptoms are worsening please call ahead to the Emergency Department and proceed there for further treatment. Recovery time appears to be roughly 1-2 weeks for mild symptoms and 3-6 weeks for severe disease.   GO IMMEDIATELY TO ER FOR FEVER YOU ARE UNABLE TO GET DOWN WITH TYLENOL, BREATHING PROBLEMS, CHEST PAIN, FATIGUE, LETHARGY, INABILITY TO EAT OR DRINK, ETC  QUARANTINE AND ISOLATION: To help decrease the spread of COVID-19 please remain isolated if you have COVID infection or are highly suspected to have COVID infection. This means -stay home and isolate to one room in the  home if you live with others. Do not share a bed or bathroom with others while ill, sanitize and wipe down all countertops and keep common areas clean and disinfected. Stay home for 5 days. If you have no symptoms or your symptoms are resolving after 5 days, you can leave your house. Continue to wear a mask around others for 5 additional days. If you have been in  close contact (within 6 feet) of someone diagnosed with COVID 19, you are advised to quarantine in your home for 14 days as symptoms can develop anywhere from 2-14 days after exposure to the virus. If you develop symptoms, you  must isolate.  Most current guidelines for COVID after exposure -unvaccinated: isolate 5 days and strict mask use x 5 days. Test on day 5 is possible -vaccinated: wear mask x 10 days if symptoms do not develop -You do not necessarily need to be tested for COVID if you have + exposure and  develop symptoms. Just isolate at home x10 days from symptom onset During this global pandemic, CDC advises to practice social distancing, try to stay at least 25ft away from others at all times. Wear a face covering. Wash and sanitize your hands regularly and avoid going anywhere that is not necessary.  KEEP IN MIND THAT THE COVID TEST IS NOT 100% ACCURATE AND YOU SHOULD STILL DO EVERYTHING TO PREVENT POTENTIAL SPREAD OF VIRUS TO OTHERS (WEAR MASK, WEAR GLOVES, WASH HANDS AND SANITIZE REGULARLY). IF INITIAL TEST IS NEGATIVE, THIS MAY NOT MEAN YOU ARE DEFINITELY NEGATIVE. MOST ACCURATE TESTING IS DONE 5-7 DAYS AFTER EXPOSURE.   It is not advised by CDC to get re-tested after receiving a positive COVID test since you can still test positive for weeks to months after you have already cleared the virus.   *If you have not been vaccinated for COVID, I strongly suggest you consider getting vaccinated as long as there are no contraindications.

## 2020-10-19 NOTE — ED Provider Notes (Signed)
[facility]    CSN: [id number] Arrival date & time: 10/19/20  1046      History   Chief Complaint Chief Complaint  Patient presents with  . Diarrhea  . Abdominal Pain    HPI Kirsten Poole is a 45 y.o. female presenting for approximately 4-day history of umbilical abdominal pain/cramping that is intermittent.  Patient states that this happens multiple times per day and the pain will radiate across the lower abdomen.  This is associated with "constant diarrhea."  She says that the stools are yellowish and watery.  She also admits to decreased appetite.  Patient states that anytime she eats or drinks anything she will have the pain in her stomach.  She denies any nausea or vomiting.  No fever.  Denies any urinary symptoms.  No vaginal discharge.  No back pain.  No blood in the urine.  Has noticed any blood in the stools or dark stools.  Denies any fatigue, weakness, cough, congestion or sore throat.  No known COVID-19 exposure.  She has used Imodium over-the-counter but only taken about 1 pill/day.  She says that she does not think it really help so she is not taking it too much.  She has not taken anything for pain relief.  No history of GI problems.  Never had any similar problems in the past.  Patient is never had any surgeries.  Patient not concerned for pregnancy.  Has an IUD.  She does admit to taking 2 days of Clindamycin for facial abscess about a week ago. No recent travel. She has no other complaints or concerns today.  HPI  Past Medical History:  Diagnosis Date  . Migraine   . Seasonal allergies   . UTI (lower urinary tract infection)     Patient Active Problem List   Diagnosis Date Noted  . Bronchitis 09/04/2013  . Irregular heart beats 03/23/2013    Past Surgical History:  Procedure Laterality Date  . NO PAST SURGERIES      OB History    Gravida  3   Para      Term      Preterm      AB      Living  3     SAB      IAB      Ectopic       Multiple      Live Births  3            Home Medications    Prior to Admission medications   Medication Sig Start Date End Date Taking? Authorizing Provider  cetirizine (ZYRTEC) 10 MG tablet Take 10 mg by mouth daily.   Yes [provider]  levonorgestrel (MIRENA) 20 MCG/24HR IUD 1 each by Intrauterine route once.   Yes [provider]  cyanocobalamin (,VITAMIN B-12,) 1000 MCG/ML injection Inject 1 mL (1,000 mcg total) into the muscle every 30 (thirty) days. 06/03/16   Shambley, Melody N, CNM  cyanocobalamin 500 MCG tablet Take 500 mcg by mouth daily. Reported on 12/23/2015    [provider]  fish oil-omega-3 fatty acids 1000 MG capsule Take 1 g by mouth daily. Reported on 12/23/2015    [provider]  fluticasone (FLONASE) 50 MCG/ACT nasal spray Place 2 sprays into both nostrils daily. 09/04/13   Rey, Richarda Overlie, NP  ketorolac (TORADOL) 10 MG tablet Take 1 tablet (10 mg total) by mouth every 6 (six) hours as needed for up to 5 days. 10/19/20  10/24/20  Shirlee Latch, PA-C  phentermine (ADIPEX-P) 37.5 MG tablet Take 1 tablet (37.5 mg total) by mouth daily before breakfast. 06/03/16   Aura Camps, Melody N, CNM    Family History Family History  Problem Relation Age of Onset  . Diabetes Mother   . Hypertension Mother   . Thyroid disease Mother   . Atrial fibrillation Father   . Stroke Father   . Thyroid disease Sister   . Thyroid disease Maternal Aunt   . Arthritis Maternal Grandmother   . Hyperlipidemia Maternal Grandmother   . Heart disease Maternal Grandmother   . Hypertension Maternal Grandmother   . Diabetes Maternal Grandmother   . Heart disease Maternal Grandfather        MI  . Hypertension Maternal Grandfather   . Arthritis Paternal Grandmother     Social History Social History   Tobacco Use  . Smoking status: Never Smoker  . Smokeless tobacco: Never Used  Vaping Use  . Vaping Use: Never used  Substance Use Topics  . Alcohol  use: No  . Drug use: No     Allergies   Other   Review of Systems Review of Systems  Constitutional: Positive for appetite change. Negative for chills, diaphoresis, fatigue and fever.  HENT: Negative for congestion, ear pain, rhinorrhea, sinus pressure, sinus pain and sore throat.   Respiratory: Negative for cough and shortness of breath.   Cardiovascular: Negative for chest pain.  Gastrointestinal: Positive for abdominal pain and diarrhea. Negative for abdominal distention, blood in stool, constipation, nausea and vomiting.  Genitourinary: Negative for difficulty urinating, dysuria, flank pain, hematuria and vaginal discharge.  Musculoskeletal: Negative for arthralgias, back pain and myalgias.  Skin: Negative for rash.  Neurological: Negative for dizziness, weakness and headaches.  Hematological: Negative for adenopathy.     Physical Exam Triage Vital Signs ED Triage Vitals  Enc Vitals Group     BP 10/19/20 1104 (!) 141/82     Pulse Rate 10/19/20 1104 88     Resp 10/19/20 1104 18     Temp 10/19/20 1104 97.8 F (36.6 C)     Temp Source 10/19/20 1104 Oral     SpO2 10/19/20 1104 99 %     Weight 10/19/20 1105 230 lb (104.3 kg)     Height 10/19/20 1105 5\' 3"  (1.6 m)     Head Circumference --      Peak Flow --      Pain Score 10/19/20 1103 8     Pain Loc --      Pain Edu? --      Excl. in GC? --    No data found.  Updated Vital Signs BP (!) 141/82 (BP Location: Left Arm)   Pulse 88   Temp 97.8 F (36.6 C) (Oral)   Resp 18   Ht 5\' 3"  (1.6 m)   Wt 230 lb (104.3 kg)   SpO2 99%   BMI 40.74 kg/m       Physical Exam Vitals and nursing note reviewed.  Constitutional:      General: She is not in acute distress.    Appearance: Normal appearance. She is well-developed. She is obese. She is not ill-appearing or toxic-appearing.     Comments: Patient tearful in exam room when describing her symptoms.  She says she is not currently having any pain but thinking about her  condition is upsetting  HENT:     Head: Normocephalic and atraumatic.     Nose: Nose normal.  Mouth/Throat:     Mouth: Mucous membranes are moist.     Pharynx: Oropharynx is clear.  Eyes:     General: No scleral icterus.       Right eye: No discharge.        Left eye: No discharge.     Conjunctiva/sclera: Conjunctivae normal.  Cardiovascular:     Rate and Rhythm: Normal rate and regular rhythm.     Heart sounds: Normal heart sounds.  Pulmonary:     Effort: Pulmonary effort is normal. No respiratory distress.     Breath sounds: Normal breath sounds.  Abdominal:     General: Bowel sounds are normal.     Tenderness: There is no abdominal tenderness. There is no right CVA tenderness, left CVA tenderness or rebound. Negative signs include Murphy's sign, McBurney's sign, psoas sign and obturator sign.  Musculoskeletal:     Cervical back: Neck supple.  Skin:    General: Skin is dry.  Neurological:     General: No focal deficit present.     Mental Status: She is alert. Mental status is at baseline.     Motor: No weakness.     Gait: Gait normal.  Psychiatric:        Mood and Affect: Mood normal.        Behavior: Behavior normal.        Thought Content: Thought content normal.      UC Treatments / Results  Labs (all labs ordered are listed, but only abnormal results are displayed) Labs Reviewed  COMPREHENSIVE METABOLIC PANEL - Abnormal; Notable for the following components:      Result Value   Potassium 3.3 (*)    All other components within normal limits  URINALYSIS, COMPLETE (UACMP) WITH MICROSCOPIC - Abnormal; Notable for the following components:   Color, Urine AMBER (*)    APPearance HAZY (*)    Specific Gravity, Urine >1.030 (*)    Hgb urine dipstick TRACE (*)    Bilirubin Urine MODERATE (*)    Ketones, ur >160 (*)    Protein, ur 30 (*)    Bacteria, UA RARE (*)    All other components within normal limits  SARS CORONAVIRUS 2 (TAT 6-24 HRS)  CBC WITH  DIFFERENTIAL/PLATELET  LIPASE, BLOOD    EKG   Radiology DG Abdomen 1 View  Result Date: 10/19/2020 CLINICAL DATA:  Abdominal pain, diarrhea EXAM: ABDOMEN - 1 VIEW COMPARISON:  None. FINDINGS: No bowel dilatation to suggest obstruction. No evidence of pneumoperitoneum, portal venous gas or pneumatosis. No pathologic calcifications along the expected course of the ureters. T-type IUD noted. No acute osseous abnormality. IMPRESSION: Negative. Electronically Signed   By: Elige Ko   On: 10/19/2020 12:03    Procedures Procedures (including critical care time)  Medications Ordered in UC Medications  sodium chloride 0.9 % bolus 1,000 mL (0 mLs Intravenous Stopped 10/19/20 1332)  ketorolac (TORADOL) 30 MG/ML injection 30 mg (30 mg Intravenous Given 10/19/20 1240)    Initial Impression / Assessment and Plan / UC Course  I have reviewed the triage vital signs and the nursing notes.  Pertinent labs & imaging results that were available during my care of the patient were reviewed by me and considered in my medical decision making (see chart for details).   45 y/o female presenting with umbilical and lower abdominal pain x 4 days with associated multiple episodes of diarrhea and decreased appetite.   In the clinic all VSS and patient in NAD, but she  is tearful in the exam room when speaking about her symptoms. Exam is actually benign and she has no abdominal tenderness.   UA today shows specific gravity >1.030, trace hgb, moderate bili, >160 ketones, and 30 protein. She does admit to not being able to eat or drink w/o pain and says she has not been eating or drinking much at all.   CBC is normal. CMP significant for only mildly decreased potassium at 3.3. Lipase WNL.   KUB obtained and normal. Independently reviewed by me.   COVID send out test performed. Advised patient on how to access results. Current CDC guidelines, isolation protocol, and ED precautions reviewed in the event of positive  test.  Patient is dehydrated mildly based on UA. BUN normal. She also has ketonuria secondary to not eating. Patient agrees to receive IV fluids. Also giving 30 mg IV ketorolac for her pain which she says has been coming and going even since being in UC.   Patient agrees to try to get stool samples while in UC. Discussed possibility of infectious diarrhea since she is having >10-15 episodes of diarrhea per day and has also had recent antibiotics.   After IV fluids ketorolac patient stated her pain was little better.  She was unable to provide stool sample today.  Patient given stool sample containers and advised to return later today or tomorrow with the sample so that we could run GI panel and C. difficile testing.  Advised patient that it is possible she could have C. difficile since she had recent antibiotic use and the description of her symptoms does match C. difficile type infections.  At this time advised her to go home and increase rest and fluids.  Advised Tylenol for pain.  Also sent a prescription for ketorolac since that seemed to help her.  Advised on ED precautions for abdominal pain and explained that she cannot wait to go to ED if her symptoms are getting worse.  Patient agreeable.  Work note provided for tomorrow.  Final Clinical Impressions(s) / UC Diagnoses   Final diagnoses:  Diarrhea, unspecified type  Lower abdominal pain  Decreased appetite     Discharge Instructions     Please return with stool samples as soon as possible. We close at 4 today but are open 8-8 tomorrow. It is possible you could have C-diff. If you do we will need to treat with antibiotics. Right now, increase rest and fluids. Go to ED for worsening symptoms.  ABDOMINAL PAIN: You may take Tylenol for pain relief. Use medications as directed including antiemetics and antidiarrheal medications if suggested or prescribed. You should increase fluids and electrolytes as well as rest over these next several  days. If you have any questions or concerns, or if your symptoms are not improving or if especially if they acutely worsen, please call or stop back to the clinic immediately and we will be happy to help you or go to the ER   ABDOMINAL PAIN RED FLAGS: Seek immediate further care if: symptoms remain the same or worsen over the next 3-7 days, you are unable to keep fluids down, you see blood or mucus in your stool, you vomit black or dark red material, you have a fever of 101.F or higher, you have localized and/or persistent abdominal pain      ED Prescriptions    Medication Sig Dispense Auth. Provider   ketorolac (TORADOL) 10 MG tablet  (Status: Discontinued) Take 1 tablet (10 mg total) by mouth every 6 (  six) hours as needed for up to 5 days. 20 tablet Eusebio FriendlyEaves, Aeisha Minarik B, PA-C   ketorolac (TORADOL) 10 MG tablet Take 1 tablet (10 mg total) by mouth every 6 (six) hours as needed for up to 5 days. 20 tablet Shirlee LatchEaves, Britlyn Martine B, PA-C     I have reviewed the PDMP during this encounter.   Shirlee Latchaves, Arlis Yale B, PA-C 10/19/20 1408

## 2020-10-20 ENCOUNTER — Other Ambulatory Visit
Admission: RE | Admit: 2020-10-20 | Discharge: 2020-10-20 | Disposition: A | Discharge status: Home / Self Care | Payer: BC Managed Care – PPO | Attending: Physician Assistant | Admitting: Physician Assistant

## 2020-10-20 ENCOUNTER — Telehealth: Payer: Self-pay | Admitting: Family Medicine

## 2020-10-20 DIAGNOSIS — R630 Anorexia: Secondary | ICD-10-CM | POA: Diagnosis not present

## 2020-10-20 DIAGNOSIS — R197 Diarrhea, unspecified: Secondary | ICD-10-CM | POA: Diagnosis not present

## 2020-10-20 DIAGNOSIS — R1030 Lower abdominal pain, unspecified: Secondary | ICD-10-CM | POA: Insufficient documentation

## 2020-10-20 LAB — GASTROINTESTINAL PANEL BY PCR, STOOL (REPLACES STOOL CULTURE)

## 2020-10-20 LAB — C DIFFICILE QUICK SCREEN W PCR REFLEX
C Diff antigen: POSITIVE — AB
C Diff interpretation: DETECTED
C Diff toxin: POSITIVE — AB

## 2020-10-20 LAB — SARS CORONAVIRUS 2 (TAT 6-24 HRS): SARS Coronavirus 2: NEGATIVE

## 2020-10-20 MED ORDER — FIDAXOMICIN 200 MG PO TABS: 200.0000 mg | ORAL_TABLET | Freq: Two times a day (BID) | ORAL | 0 refills | Status: AC

## 2020-10-20 NOTE — Telephone Encounter (Signed)
C diff positive. Rx sent to pharmacy.  Everlene Other DO [facility]

## 2021-08-08 IMAGING — CR DG ABDOMEN 1V
2 series · 2 of 2 positions shown · non-contrast
Comparison: None.

CLINICAL DATA: Abdominal pain, diarrhea

EXAM:
ABDOMEN - 1 VIEW

[abdomen kub (1 of 2)]
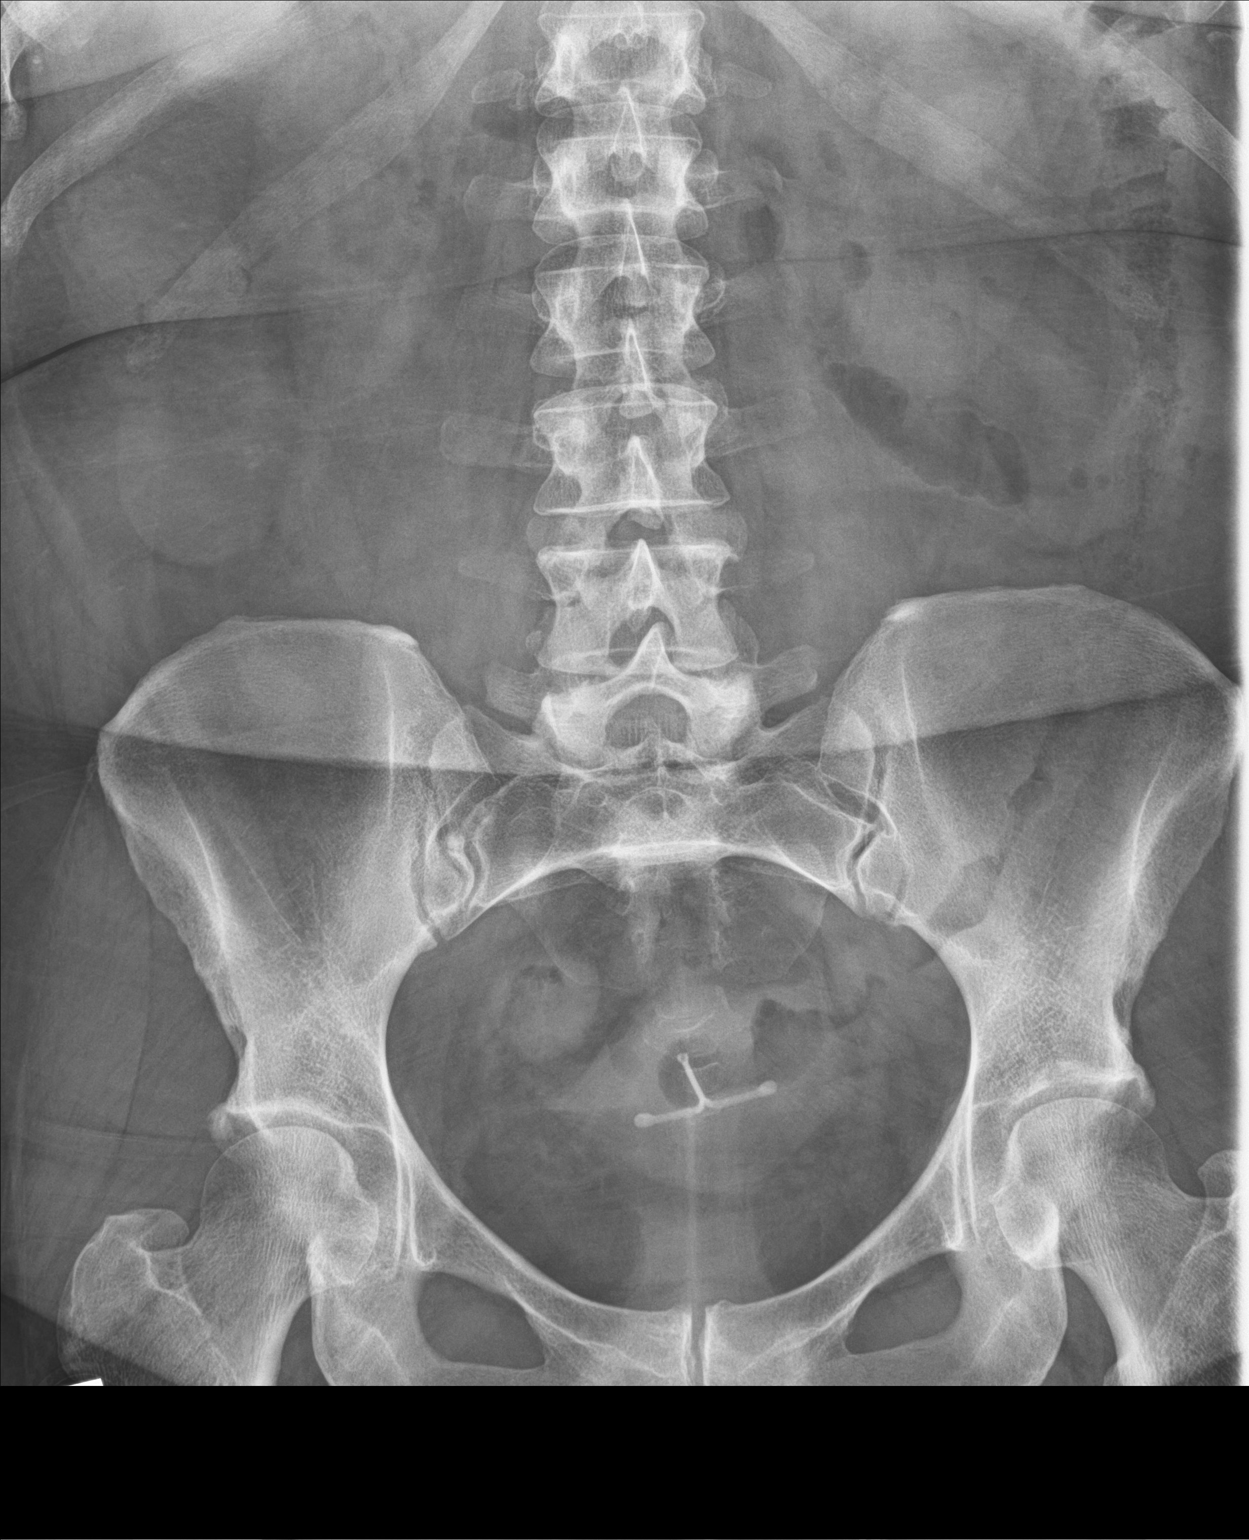

[abdomen kub (2 of 2)]
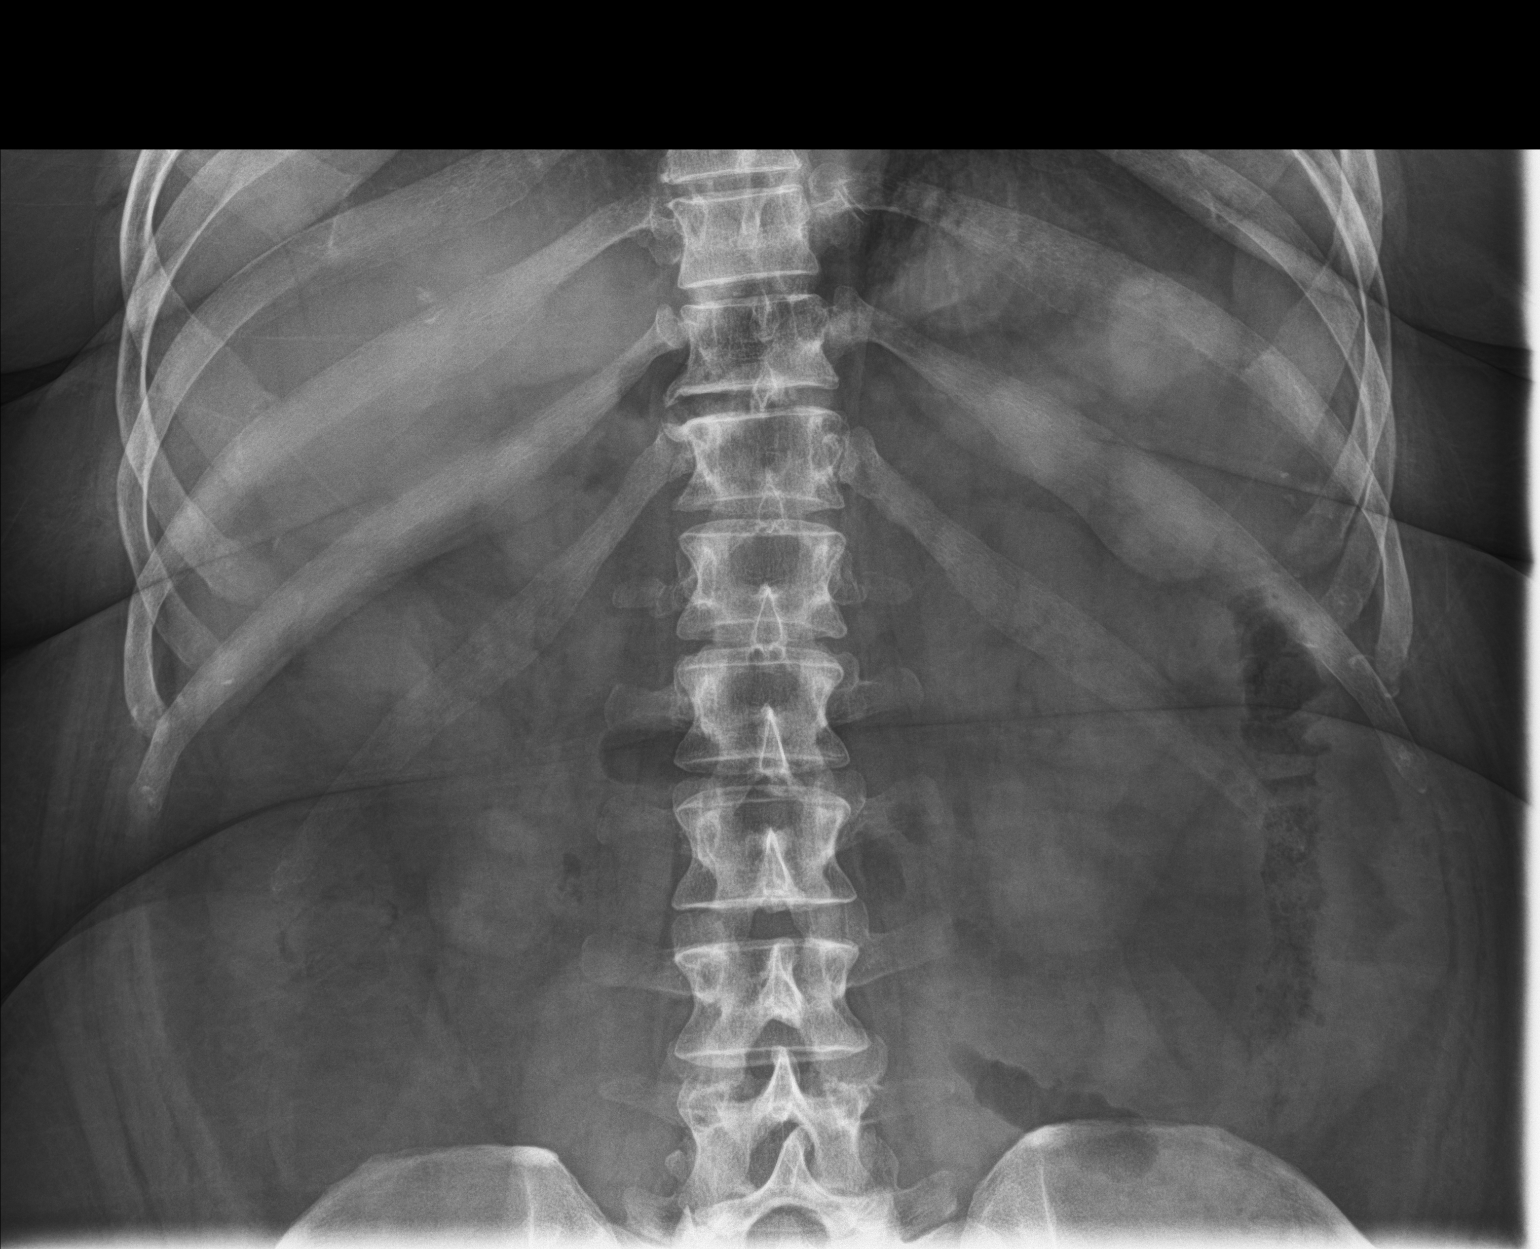

[2 of 2 positions shown; findings below may reference images not displayed]

FINDINGS: No bowel dilatation to suggest obstruction. No evidence of
pneumoperitoneum, portal venous gas or pneumatosis.

No pathologic calcifications along the expected course of the
ureters. T-type IUD noted.

No acute osseous abnormality.
IMPRESSION: Negative.

## 2022-12-31 ENCOUNTER — Encounter: Payer: Self-pay | Admitting: Nurse Practitioner

## 2022-12-31 ENCOUNTER — Ambulatory Visit: Payer: BC Managed Care – PPO | Attending: Nurse Practitioner

## 2022-12-31 ENCOUNTER — Ambulatory Visit: Payer: BC Managed Care – PPO | Admitting: Nurse Practitioner

## 2022-12-31 VITALS — BP 130/84 | HR 117 | Temp 97.9°F | Resp 16 | Ht 63.0 in | Wt 238.2 lb

## 2022-12-31 DIAGNOSIS — Z114 Encounter for screening for human immunodeficiency virus [HIV]: Secondary | ICD-10-CM

## 2022-12-31 DIAGNOSIS — Z01419 Encounter for gynecological examination (general) (routine) without abnormal findings: Secondary | ICD-10-CM

## 2022-12-31 DIAGNOSIS — Z131 Encounter for screening for diabetes mellitus: Secondary | ICD-10-CM

## 2022-12-31 DIAGNOSIS — I10 Essential (primary) hypertension: Secondary | ICD-10-CM

## 2022-12-31 DIAGNOSIS — Z7689 Persons encountering health services in other specified circumstances: Secondary | ICD-10-CM

## 2022-12-31 DIAGNOSIS — R000 Tachycardia, unspecified: Secondary | ICD-10-CM

## 2022-12-31 DIAGNOSIS — Z1231 Encounter for screening mammogram for malignant neoplasm of breast: Secondary | ICD-10-CM | POA: Diagnosis not present

## 2022-12-31 DIAGNOSIS — Z1159 Encounter for screening for other viral diseases: Secondary | ICD-10-CM | POA: Diagnosis not present

## 2022-12-31 DIAGNOSIS — Z6841 Body Mass Index (BMI) 40.0 and over, adult: Secondary | ICD-10-CM

## 2022-12-31 DIAGNOSIS — Z13220 Encounter for screening for lipoid disorders: Secondary | ICD-10-CM

## 2022-12-31 DIAGNOSIS — Z130 Encounter for screening for diseases of the blood and blood-forming organs and certain disorders involving the immune mechanism: Secondary | ICD-10-CM

## 2022-12-31 LAB — CBC WITH DIFFERENTIAL/PLATELET
Absolute Monocytes: 487 cells/uL (ref 200–950)
Basophils Relative: 0.7 %
Eosinophils Absolute: 174 cells/uL (ref 15–500)
Hemoglobin: 13.5 g/dL (ref 11.7–15.5)
Lymphs Abs: 2192 cells/uL (ref 850–3900)
MPV: 10.4 fL (ref 7.5–12.5)
Monocytes Relative: 5.6 %
Neutro Abs: 5786 cells/uL (ref 1500–7800)
Neutrophils Relative %: 66.5 %
Platelets: 401 10*3/uL — ABNORMAL HIGH (ref 140–400)
RDW: 13.3 % (ref 11.0–15.0)
WBC: 8.7 10*3/uL (ref 3.8–10.8)

## 2022-12-31 MED ORDER — HYDROCHLOROTHIAZIDE 12.5 MG PO TABS: 12.5000 mg | ORAL_TABLET | Freq: Every day | ORAL | 0 refills | Status: DC

## 2022-12-31 NOTE — Assessment & Plan Note (Signed)
Start hydrochlorothiazide 12.5 mg daily, follow up in 4 weeks

## 2022-12-31 NOTE — Progress Notes (Signed)
BP 130/84   Pulse (!) 117   Temp 97.9 F (36.6 C)   Resp 16   Ht  (1.6 m)   Wt 238 lb 3.2 oz (108 kg)   SpO2 97%   BMI 42.20 kg/m    Subjective:    Patient ID: Kirsten Poole, female    DOB: 1975-11-16, 47 y.o.   MRN: [id number]  HPI: Kirsten Poole is a 47 y.o. female  Chief Complaint  Patient presents with   Establish Care    Referral GYN: pap and IUD removal   Hypertension    Never dx but has been running high   Establish care: her last physical was long time ago.  Medical history includes none.  Family history includes htn, DM.  Health maintenance labs, pap.   Htn: her blood pressure was elevated when she went to urgent care on 12/13/2022, it was 104/105. She says over the last few months she also noticed some vision changes.   She says she has been getting elevated blood pressure at home 138/108, 128/84.  She denies any chest pain.  She does report palpitations.  She also reports that she does get some swelling in her lower extremities. Will start patient on a low dose hydrochlorothiazide.   Palpitations:  she says she has had palpitations for years.  She says that she did a holter monitor in 2014. She says she has been noticing that she has elevated heart rate.  She denies any chest pain or shortness of breath. She says she has one caffeine beverage a day. Will get labs, EKG, and zio patch. EKG showed : sinus rhythm  Obesity: her weight today is 238 lbs, with a BMI of 42.20.  comorbidity include htn, palpitations.  Recommendation:  lifestyle modification which includes eating a well-balanced diet with portion control and increasing physical activity as tolerated.  Relevant past medical, surgical, family and social history reviewed and updated as indicated. Interim medical history since our last visit reviewed. Allergies and medications reviewed and updated.  Review of Systems  Constitutional: Negative for fever or weight change.  Respiratory: Negative for cough  and shortness of breath.   Cardiovascular: Negative for chest pain positive for  palpitations.  Gastrointestinal: Negative for abdominal pain, no bowel changes.  Musculoskeletal: Negative for gait problem or joint swelling.  Skin: Negative for rash.  Neurological: Negative for dizziness or headache.  No other specific complaints in a complete review of systems (except as listed in HPI above).      Objective:    BP 130/84   Pulse (!) 117   Temp 97.9 F (36.6 C)   Resp 16   Ht  (1.6 m)   Wt 238 lb 3.2 oz (108 kg)   SpO2 97%   BMI 42.20 kg/m   Wt Readings from Last 3 Encounters:  12/31/22 238 lb 3.2 oz (108 kg)  10/19/20 230 lb (104.3 kg)  10/19/17 235 lb (106.6 kg)    Physical Exam  Constitutional: Patient appears well-developed and well-nourished. Obese  No distress.  HEENT: head atraumatic, normocephalic, pupils equal and reactive to light, neck supple Cardiovascular: tachycardic rate, regular rhythm and normal heart sounds.  No murmur heard. No BLE edema. Pulmonary/Chest: Effort normal and breath sounds normal. No respiratory distress. Abdominal: Soft.  There is no tenderness. Psychiatric: Patient has a normal mood and affect. behavior is normal. Judgment and thought content normal.      Assessment & Plan:   Problem List  Items Addressed This Visit       Cardiovascular and Mediastinum   Primary hypertension - Primary    Start hydrochlorothiazide 12.5 mg daily, follow up in 4 weeks      Relevant Medications   hydrochlorothiazide (HYDRODIURIL) 12.5 MG tablet   Other Relevant Orders   CBC with Differential/Platelet   COMPLETE METABOLIC PANEL WITH GFR     Other   Morbid obesity with BMI of 40.0-44.9, adult    Recommendation:  lifestyle modification which includes eating a well-balanced diet with portion control and increasing physical activity as tolerated.      Tachycardia    Getting labs, EKG, and zio patch      Relevant Orders   TSH   EKG 12-Lead    LONG TERM MONITOR (3-14 DAYS)   Other Visit Diagnoses     Screening for HIV without presence of risk factors       Relevant Orders   HIV antibody (with reflex)   Encounter for hepatitis C screening test for low risk patient       Relevant Orders   Hepatitis C Antibody   Encounter for screening mammogram for malignant neoplasm of breast       Relevant Orders   MM 3D SCREENING MAMMOGRAM BILATERAL BREAST   Women's annual routine gynecological examination       Relevant Orders   Ambulatory referral to Obstetrics / Gynecology   Screening for diabetes mellitus       Relevant Orders   COMPLETE METABOLIC PANEL WITH GFR   Hemoglobin A1c   Screening for cholesterol level       Relevant Orders   Lipid panel   Screening for deficiency anemia       Relevant Orders   CBC with Differential/Platelet   Encounter to establish care       schedule cpe        Follow up plan: Return in about 4 weeks (around 01/28/2023) for follow up and schedule cpe.

## 2022-12-31 NOTE — Assessment & Plan Note (Signed)
Recommendation:  lifestyle modification which includes eating a well-balanced diet with portion control and increasing physical activity as tolerated.

## 2022-12-31 NOTE — Assessment & Plan Note (Signed)
Getting labs, EKG, and zio patch

## 2023-01-01 LAB — COMPLETE METABOLIC PANEL WITH GFR
AG Ratio: 1.8 (calc) (ref 1.0–2.5)
ALT: 16 U/L (ref 6–29)
AST: 16 U/L (ref 10–30)
Albumin: 4.2 g/dL (ref 3.6–5.1)
Alkaline phosphatase (APISO): 81 U/L (ref 31–125)
BUN/Creatinine Ratio: 15 (calc) (ref 6–22)
BUN: 17 mg/dL (ref 7–25)
CO2: 26 mmol/L (ref 20–32)
Calcium: 9.5 mg/dL (ref 8.6–10.2)
Chloride: 104 mmol/L (ref 98–110)
Creat: 1.14 mg/dL — ABNORMAL HIGH (ref 0.50–0.99)
Globulin: 2.4 g/dL (calc) (ref 1.9–3.7)
Glucose, Bld: 81 mg/dL (ref 65–99)
Potassium: 4.3 mmol/L (ref 3.5–5.3)
Sodium: 141 mmol/L (ref 135–146)
Total Bilirubin: 0.3 mg/dL (ref 0.2–1.2)
Total Protein: 6.6 g/dL (ref 6.1–8.1)
eGFR: 61 mL/min/{1.73_m2} (ref >=60)

## 2023-01-01 LAB — LIPID PANEL
Cholesterol: 168 mg/dL (ref <200)
HDL: 43 mg/dL — ABNORMAL LOW (ref >=50)
LDL Cholesterol (Calc): 96 mg/dL (calc)
Non-HDL Cholesterol (Calc): 125 mg/dL (calc) (ref <130)
Total CHOL/HDL Ratio: 3.9 (calc) (ref <5.0)
Triglycerides: 202 mg/dL — ABNORMAL HIGH (ref <150)

## 2023-01-01 LAB — CBC WITH DIFFERENTIAL/PLATELET
Basophils Absolute: 61 cells/uL (ref 0–200)
Eosinophils Relative: 2 %
HCT: 40.5 % (ref 35.0–45.0)
MCH: 28 pg (ref 27.0–33.0)
MCHC: 33.3 g/dL (ref 32.0–36.0)
MCV: 84 fL (ref 80.0–100.0)
RBC: 4.82 10*6/uL (ref 3.80–5.10)
Total Lymphocyte: 25.2 %

## 2023-01-01 LAB — HEMOGLOBIN A1C
Hgb A1c MFr Bld: 5.7 % of total Hgb — ABNORMAL HIGH (ref <5.7)
Mean Plasma Glucose: 117 mg/dL
eAG (mmol/L): 6.5 mmol/L

## 2023-01-01 LAB — HEPATITIS C ANTIBODY: Hepatitis C Ab: NONREACTIVE

## 2023-01-01 LAB — TSH: TSH: 1.57 mIU/L

## 2023-01-01 LAB — HIV ANTIBODY (ROUTINE TESTING W REFLEX): HIV 1&2 Ab, 4th Generation: NONREACTIVE

## 2023-01-04 DIAGNOSIS — R000 Tachycardia, unspecified: Secondary | ICD-10-CM

## 2023-01-24 ENCOUNTER — Other Ambulatory Visit: Payer: Self-pay | Admitting: Nurse Practitioner

## 2023-01-24 DIAGNOSIS — I10 Essential (primary) hypertension: Secondary | ICD-10-CM

## 2023-01-25 ENCOUNTER — Other Ambulatory Visit: Payer: Self-pay

## 2023-01-25 DIAGNOSIS — I10 Essential (primary) hypertension: Secondary | ICD-10-CM

## 2023-01-25 NOTE — Telephone Encounter (Signed)
Requested medication (s) are due for refill today: Yes  Requested medication (s) are on the active medication list: Yes  Last refill:  12/31/22  Future visit scheduled: Yes  Notes to clinic:  Pharmacy asking for 90 day supply and diagnosis code.    Requested Prescriptions  Pending Prescriptions Disp Refills   hydrochlorothiazide (HYDRODIURIL) 12.5 MG tablet [Pharmacy Med Name: HYDROCHLOROTHIAZIDE 12.5 MG TB] 90 tablet 1    Sig: TAKE 1 TABLET BY MOUTH EVERY DAY     Cardiovascular: Diuretics - Thiazide Failed - 01/24/2023  1:32 PM      Failed - Cr in normal range and within 180 days    Creat  Date Value Ref Range Status  12/31/2022 1.14 (H) 0.50 - 0.99 mg/dL Final         Passed - K in normal range and within 180 days    Potassium  Date Value Ref Range Status  12/31/2022 4.3 3.5 - 5.3 mmol/L Final         Passed - Na in normal range and within 180 days    Sodium  Date Value Ref Range Status  12/31/2022 141 135 - 146 mmol/L Final  12/23/2015 138 134 - 144 mmol/L Final         Passed - Last BP in normal range    BP Readings from Last 1 Encounters:  12/31/22 130/84         Passed - Valid encounter within last 6 months    Recent Outpatient Visits           3 weeks ago Primary hypertension   [facility] [name], FNP       Future Appointments             In 3 days [name], FNP [facility]   In 1 month [name], [name], FNP [facility], [facility]

## 2023-01-28 ENCOUNTER — Other Ambulatory Visit (HOSPITAL_COMMUNITY)
Admission: RE | Admit: 2023-01-28 | Discharge: 2023-01-28 | Disposition: A | Discharge status: Home / Self Care | Payer: BC Managed Care – PPO | Source: Ambulatory Visit | Attending: Obstetrics | Admitting: Obstetrics

## 2023-01-28 ENCOUNTER — Other Ambulatory Visit: Payer: Self-pay

## 2023-01-28 ENCOUNTER — Ambulatory Visit: Payer: BC Managed Care – PPO | Admitting: Obstetrics

## 2023-01-28 ENCOUNTER — Ambulatory Visit (INDEPENDENT_AMBULATORY_CARE_PROVIDER_SITE_OTHER): Payer: BC Managed Care – PPO | Admitting: Nurse Practitioner

## 2023-01-28 ENCOUNTER — Encounter: Payer: Self-pay | Admitting: Nurse Practitioner

## 2023-01-28 ENCOUNTER — Encounter: Payer: Self-pay | Admitting: Obstetrics

## 2023-01-28 VITALS — BP 128/70 | HR 90 | Temp 98.0°F | Resp 16 | Ht 63.0 in | Wt 235.5 lb

## 2023-01-28 VITALS — BP 128/97 | HR 91 | Ht 63.0 in | Wt 233.9 lb

## 2023-01-28 DIAGNOSIS — Z01419 Encounter for gynecological examination (general) (routine) without abnormal findings: Secondary | ICD-10-CM | POA: Diagnosis not present

## 2023-01-28 DIAGNOSIS — Z1231 Encounter for screening mammogram for malignant neoplasm of breast: Secondary | ICD-10-CM

## 2023-01-28 DIAGNOSIS — Z124 Encounter for screening for malignant neoplasm of cervix: Secondary | ICD-10-CM | POA: Insufficient documentation

## 2023-01-28 DIAGNOSIS — I10 Essential (primary) hypertension: Secondary | ICD-10-CM | POA: Diagnosis not present

## 2023-01-28 DIAGNOSIS — R002 Palpitations: Secondary | ICD-10-CM | POA: Diagnosis not present

## 2023-01-28 DIAGNOSIS — Z6841 Body Mass Index (BMI) 40.0 and over, adult: Secondary | ICD-10-CM

## 2023-01-28 DIAGNOSIS — Z30432 Encounter for removal of intrauterine contraceptive device: Secondary | ICD-10-CM

## 2023-01-28 MED ORDER — HYDROCHLOROTHIAZIDE 12.5 MG PO TABS
12.5000 mg | ORAL_TABLET | Freq: Every day | ORAL | 1 refills | Status: DC
Start: 2023-01-28 — End: 2023-05-11

## 2023-01-28 NOTE — Assessment & Plan Note (Signed)
Continue taking hydrochlorothiazide 12.5 mg daily.  Continue to work on lifestyle modification.  Keep blood pressure log.

## 2023-01-28 NOTE — Assessment & Plan Note (Signed)
Referral placed for [facility] weight and wellness program.  Patient reports she is ready to lose weight.

## 2023-01-28 NOTE — Progress Notes (Signed)
Gynecology Annual Exam  PCP: [name], FNP  Chief Complaint:  Chief Complaint  Patient presents with   Annual Exam    History of Present Illness: Patient is a 47 y.o. G3P0 presents for annual exam. The patient has no complaints today.   LMP: No LMP recorded. (Menstrual status: IUD). Average Interval: rare, and usually no periods on her IUD,  days  Heavy Menses: no Clots: no Intermenstrual Bleeding: no Postcoital Bleeding: no Dysmenorrhea: no   The patient is sexually active. She currently uses IUD for contraception. She denies dyspareunia.  The patient does perform self breast exams.  There is no notable family history of breast or ovarian cancer in her family.  The patient wears seatbelts: yes.   The patient has regular exercise: no.    The patient denies current symptoms of depression.    Review of Systems: Review of Systems  Constitutional: Negative.   HENT: Negative.    Eyes: Negative.   Respiratory: Negative.    Cardiovascular: Negative.   Gastrointestinal: Negative.   Genitourinary: Negative.   Musculoskeletal: Negative.   Skin: Negative.   Neurological: Negative.   Endo/Heme/Allergies: Negative.   Psychiatric/Behavioral: Negative.      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 Left>right; continues with problems with foot edema towards end of day with left>right. Feels this is a burden to QOL. Referral to [facility] for evaluation; continue lifestyle interventions as discussed elsewhere.    Morbid obesity with BMI of 40.0-44.9, adult (HCC) 04/08/2015    Last Assessment & Plan: Formatting of this note might be different from the original. Goals set today for weight loss, diet changes, and increased exercise. Formatting of this  note might be different from the original. Last Assessment & Plan: Goals set today for weight loss, diet changes, and increased exercise.     Past Surgical History:  Past Surgical History:  Procedure Laterality Date   NO PAST SURGERIES      Gynecologic History:  No LMP recorded. (Menstrual status: IUD). Contraception: IUD she requests removal today Last Pap: Results were: NILM per pateint report. No pap in about 2 + years  no documentation   No records on mammograms Obstetric History: G3P0  Family History:  Family History  Problem Relation Age of Onset   Diabetes Mother    Hypertension Mother    Thyroid disease Mother    Atrial fibrillation Father    Stroke Father    Thyroid disease Sister    Thyroid disease Maternal Aunt    Arthritis Maternal Grandmother    Hyperlipidemia Maternal Grandmother    Heart disease Maternal Grandmother    Hypertension Maternal Grandmother    Diabetes Maternal Grandmother    Heart disease Maternal Grandfather        MI   Hypertension Maternal Grandfather    Arthritis Paternal Grandmother     Social History:  Social History   Socioeconomic History   Marital status: Married    Spouse name: Kirsten Poole   Number of children: 3   Years of education: 14   Highest education level: Not on file  Occupational History   Occupation: Surveyor, minerals: [employer]    Comment: [employer]  Tobacco Use  Smoking status: Never   Smokeless tobacco: Never  Vaping Use   Vaping Use: Never used  Substance and Sexual Activity   Alcohol use: No   Drug use: No   Sexual activity: Yes    Birth control/protection: I.U.D.    Comment: mirena  Other Topics Concern   Not on file  Social History Narrative   Kirsten Poole grew up in [place]. She lives at home with her husband of 15 years that their 3 children (Kirsten Poole 12, Kirsten Poole 10, Kirsten Poole 9). They have a dog and cat. 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 She  attended [employer] for her pre-requisites in nursing but realized this was not what she wanted to do. She is very busy with her 3 boys and baseball.    Social Determinants of Health   Financial Resource Strain: Not on file  Food Insecurity: Not on file  Transportation Needs: Not on file  Physical Activity: Not on file  Stress: Not on file  Social Connections: Not on file  Intimate Partner Violence: Not on file    Allergies:  Allergies  Allergen Reactions   Other     Other reaction(s): Other (See Comments) Seasonal outside allergies (sneezing)    Medications: Prior to Admission medications   Medication Sig Start Date End Date Taking? Authorizing Provider  hydrochlorothiazide (HYDRODIURIL) 12.5 MG tablet Take 1 tablet (12.5 mg total) by mouth daily. 01/28/23   [name], FNP    Physical Exam Vitals: Blood pressure (!) 128/97, pulse 91, height 5\' 3"  (1.6 m), weight 233 lb 14.4 oz (106.1 kg).  General: NAD HEENT: normocephalic, anicteric Thyroid: no enlargement, no palpable nodules Pulmonary: No increased work of breathing, CTAB Cardiovascular: RRR, distal pulses 2+ Breast: Breast symmetrical, no tenderness, no palpable nodules or masses, no skin or nipple retraction present, no nipple discharge.  No axillary or supraclavicular lymphadenopathy. Abdomen: NABS, soft, non-tender, non-distended.  Umbilicus without lesions.  No hepatomegaly, splenomegaly or masses palpable. No evidence of hernia  Genitourinary:  External: Normal external female genitalia.  Normal urethral meatus, normal Bartholin's and Skene's glands.    Vagina: Normal vaginal mucosa, no evidence of prolapse.    Cervix: Grossly normal in appearance, no bleeding  Uterus: Non-enlarged, mobile, normal contour.  No CMT  Adnexa: ovaries non-enlarged, no adnexal masses  Rectal: deferred  Lymphatic: no evidence of inguinal lymphadenopathy Extremities: no edema, erythema, or tenderness Neurologic:  Grossly intact Psychiatric: mood appropriate, affect full  Female chaperone present for pelvic and breast  portions of the physical exam    Assessment: 47 y.o. G3P0 routine annual exam  Plan: Problem List Items Addressed This Visit   None Visit Diagnoses     Screening for cervical cancer    -  Primary   Relevant Orders   Cytology - PAP       1) Mammogram - recommend yearly screening mammogram.  Mammogram  has been ordered  by her PCP. We have given her the contact number for [place]   2) STI screening  wasoffered and declined  3) ASCCP guidelines and rational discussed.  Patient opts for every 3 years screening interval  4) Contraception - the patient is currently using  IUD.  She is  asking for her IUD to be removed today.  5) Colonoscopy -- Screening recommended starting at age 63 for average risk individuals, age 46 for individuals deemed at increased risk (including African Americans) and recommended to continue until age 71.  For patient age  76-85 individualized approach is recommended.  Gold standard screening is via colonoscopy, Cologuard screening is an acceptable alternative for patient unwilling or unable to undergo colonoscopy.  "Colorectal cancer screening for average?risk adults: 2018 guideline update from the American Cancer Society"CA: A Cancer Journal for Clinicians: Feb 09, 2017   6) Routine healthcare maintenance including cholesterol, diabetes screening discussed managed by PCP  7) Return in about 1 year (around 01/28/2024) for annual.  IUD removal Note:   GYNECOLOGY OFFICE PROCEDURE NOTE  Kirsten Poole is a 47 y.o. G3P0 here for Mirena IUD removal placed  in 2017. She desires removal secondary to it having been in place over 6 years...  IUD Removal  Patient identified, informed consent performed, consent signed.  Patient was in the dorsal lithotomy position, normal external genitalia was noted.  A speculum was placed in the patient's vagina, normal  discharge was noted, no lesions. The cervix was visualized, no lesions, no abnormal discharge.  The strings of the IUD were grasped and pulled using ring forceps. The IUD was removed in its entirety..  Patient tolerated the procedure well.    Patient will use nothing for contraception, unless she opts for barrier methods that we discussed today. She is going to monitor and see whether she continues to have cycles. Her husband may get a vasectomy. Routine preventative health maintenance measures emphasized.   Kirsten Poole, CNM  01/28/2023 6:04 PM    CPT [id number]     Kirsten Poole, CNM  01/28/2023 5:56 PM   01/28/2023, 5:55 PM

## 2023-01-28 NOTE — Progress Notes (Signed)
BP 128/70   Pulse 90   Temp 98 F (36.7 C) (Oral)   Resp 16   Ht 5\' 3"  (1.6 m)   Wt 235 lb 8 oz (106.8 kg)   SpO2 95%   BMI 41.72 kg/m    Subjective:    Patient ID: Kirsten Poole, female    DOB: [birth year], 47 y.o.   MRN: [id number]  HPI: TYREANNA GHAZAL is a 47 y.o. female  Chief Complaint  Patient presents with   Hypertension    4 week recheck   Htn: Blood pressure today is 128/70.  He is currently taking hydrochlorothiazide 12.5 mg daily. She denies any chest pain.  She is still having palpitations. Discussed starting a beta blocker she would like to hold off for now.    Palpitations:  she says she has had palpitations for years.  She says that she did a holter monitor in 2014. She says she has been noticing that she has elevated heart rate.  She denies any chest pain or shortness of breath. She says she has one caffeine beverage a day.  EKG showed : sinus rhythm Zio patch showed : HR 54 - 168 bpm, average 88 bpm. 2 nonsustained SVT, longest 13 beats. Rhythm strip suggests salvo of atrial tachycardia. Rare supraventricular and ventricular ectopy. No sustained arrhythmias. Symptom triggers correspond to sinus rhythm w PACs/AT.   Discussed starting a beta-blocker patient would like to hold off for now.  Obesity: Her weight at last appointment was 238 pounds.  Today her weight is 235 lbs with a BMI of 41.72.  Comorbidities include hypertension, palpitations.  Recommend lifestyle modification which includes eating a well-balanced diet with portion control and increasing physical activity as tolerated. [facility].     Relevant past medical, surgical, family and social history reviewed and updated as indicated. Interim medical history since our last visit reviewed. Allergies and medications reviewed and updated.  Review of Systems  Constitutional: Negative for fever or weight change.  Respiratory: Negative for cough and shortness of breath.    Cardiovascular: Negative for chest pain positive for  palpitations.  Gastrointestinal: Negative for abdominal pain, no bowel changes.  Musculoskeletal: Negative for gait problem or joint swelling.  Skin: Negative for rash.  Neurological: Negative for dizziness or headache.  No other specific complaints in a complete review of systems (except as listed in HPI above).      Objective:    BP 128/70   Pulse 90   Temp 98 F (36.7 C) (Oral)   Resp 16   Ht 5\' 3"  (1.6 m)   Wt 235 lb 8 oz (106.8 kg)   SpO2 95%   BMI 41.72 kg/m   Wt Readings from Last 3 Encounters:  01/28/23 235 lb 8 oz (106.8 kg)  01/28/23 233 lb 14.4 oz (106.1 kg)  12/31/22 238 lb 3.2 oz (108 kg)    Physical Exam  Constitutional: Patient appears well-developed and well-nourished. Obese  No distress.  HEENT: head atraumatic, normocephalic, pupils equal and reactive to light, neck supple Cardiovascular: normal rate, regular rhythm and normal heart sounds.  No murmur heard. No BLE edema. Pulmonary/Chest: Effort normal and breath sounds normal. No respiratory distress. Abdominal: Soft.  There is no tenderness. Psychiatric: Patient has a normal mood and affect. behavior is normal. Judgment and thought content normal.      Assessment & Plan:   Problem List Items Addressed This Visit       Cardiovascular and  Mediastinum   Primary hypertension    Continue taking hydrochlorothiazide 12.5 mg daily.  Continue to work on lifestyle modification.  Keep blood pressure log.      Relevant Medications   hydrochlorothiazide (HYDRODIURIL) 12.5 MG tablet     Other   Morbid obesity with BMI of 40.0-44.9, adult (HCC) - Primary    Referral placed for [facility] program.  Patient reports she is ready to lose weight.      Relevant Orders   Amb Ref to Medical Weight Management   Other Visit Diagnoses     Palpitations       Discussed starting beta-blocker.  Patient like to hold off for now she would like to work  on losing weight        Follow up plan: Return in about 6 months (around 07/31/2023) for follow up.

## 2023-02-02 LAB — CYTOLOGY - PAP
Comment: NEGATIVE
Diagnosis: NEGATIVE
High risk HPV: NEGATIVE

## 2023-02-03 ENCOUNTER — Ambulatory Visit (INDEPENDENT_AMBULATORY_CARE_PROVIDER_SITE_OTHER): Payer: BC Managed Care – PPO | Admitting: Adult Health

## 2023-02-03 VITALS — BP 122/89 | HR 89 | Ht 62.75 in | Wt 230.0 lb

## 2023-02-03 DIAGNOSIS — Z0289 Encounter for other administrative examinations: Secondary | ICD-10-CM

## 2023-02-03 DIAGNOSIS — R000 Tachycardia, unspecified: Secondary | ICD-10-CM | POA: Diagnosis not present

## 2023-02-03 DIAGNOSIS — Z6841 Body Mass Index (BMI) 40.0 and over, adult: Secondary | ICD-10-CM

## 2023-02-03 NOTE — Progress Notes (Signed)
Office: [phone]  /  Fax: [phone]   Initial Visit  Kirsten Poole was seen in clinic today to evaluate for obesity. She is interested in losing weight to improve overall health and reduce the risk of weight related complications. She presents today to review program treatment options, initial physical assessment, and evaluation.     She was referred by: PCP  When asked what else they would like to accomplish? She states: Adopt healthier eating patterns and Reduce number of medications  Weight history: Began gaining weight 5th/6th grade stayed with her Aunt and consumed large amounts of homemade CHO foods  When asked how has your weight affected you? She states: Contributed to medical problems and Having poor endurance  Some associated conditions: Hypertension  Contributing factors: Family history  Weight promoting medications identified: None  Current nutrition plan: Low-carb  Current level of physical activity: None  Current or previous pharmacotherapy: Phentermine >10 years ago  Response to medication: Lost weight initially but was unable to sustain weight loss Lost 30 lbs, down to 200, then regained back to current 230 lbs   Past medical history includes:   Past Medical History:  Diagnosis Date   Migraine    Seasonal allergies    UTI (lower urinary tract infection)      Objective:   BP 122/89   Pulse 89   Ht 5' 2.75" (1.594 m)   Wt 230 lb (104.3 kg)   SpO2 97%   BMI 41.07 kg/m  She was weighed on the bioimpedance scale: Body mass index is 41.07 kg/m.  Peak Weight:242 lbs , Body Fat%: 48.9, Visceral Fat Rating:14, Weight trend over the last 12 months: Decreasing  General:  Alert, oriented and cooperative. Patient is in no acute distress.  Respiratory: Normal respiratory effort, no problems with respiration noted   Gait: able to ambulate independently  Mental Status: Normal mood and affect. Normal behavior. Normal judgment and thought content.    DIAGNOSTIC DATA REVIEWED:  BMET    Component Value Date/Time   NA 141 12/31/2022 1550   NA 138 12/23/2015 1146   K 4.3 12/31/2022 1550   CL 104 12/31/2022 1550   CO2 26 12/31/2022 1550   GLUCOSE 81 12/31/2022 1550   BUN 17 12/31/2022 1550   BUN 14 12/23/2015 1146   CREATININE 1.14 (H) 12/31/2022 1550   CALCIUM 9.5 12/31/2022 1550   GFRNONAA >60 10/19/2020 1140   GFRAA 112 12/23/2015 1146   Lab Results  Component Value Date   HGBA1C 5.7 (H) 12/31/2022   HGBA1C 5.6 12/23/2015   No results found for: "INSULIN" CBC    Component Value Date/Time   WBC 8.7 12/31/2022 1550   RBC 4.82 12/31/2022 1550   HGB 13.5 12/31/2022 1550   HCT 40.5 12/31/2022 1550   PLT 401 (H) 12/31/2022 1550   MCV 84.0 12/31/2022 1550   MCH 28.0 12/31/2022 1550   MCHC 33.3 12/31/2022 1550   RDW 13.3 12/31/2022 1550   Iron/TIBC/Ferritin/ %Sat No results found for: "IRON", "TIBC", "FERRITIN", "IRONPCTSAT" Lipid Panel     Component Value Date/Time   CHOL 168 12/31/2022 1550   CHOL 149 12/23/2015 1146   TRIG 202 (H) 12/31/2022 1550   HDL 43 (L) 12/31/2022 1550   HDL 43 12/23/2015 1146   CHOLHDL 3.9 12/31/2022 1550   LDLCALC 96 12/31/2022 1550   Hepatic Function Panel     Component Value Date/Time   PROT 6.6 12/31/2022 1550   PROT 6.7 12/23/2015 1146   ALBUMIN 3.8  10/19/2020 1140   ALBUMIN 4.1 12/23/2015 1146   AST 16 12/31/2022 1550   ALT 16 12/31/2022 1550   ALKPHOS 59 10/19/2020 1140   BILITOT 0.3 12/31/2022 1550   BILITOT 0.3 12/23/2015 1146      Component Value Date/Time   TSH 1.57 12/31/2022 1550     Assessment and Plan:   Morbid obesity with BMI of 40.0-44.9, adult (HCC), Starting BMI 40.8  Tachycardia        Obesity Treatment / Action Plan:  Patient will work on garnering support from family and friends to begin weight loss journey. Will work on eliminating or reducing the presence of highly palatable, calorie dense foods in the home. Will complete provided  nutritional and psychosocial assessment questionnaire before the next appointment. Will be scheduled for indirect calorimetry to determine resting energy expenditure in a fasting state.  This will allow [place] to create a reduced calorie, high-protein meal plan to promote loss of fat mass while preserving muscle mass. Counseled on the health benefits of losing 5%-15% of total body weight. Was counseled on nutritional approaches to weight loss and benefits of reducing processed foods and consuming plant-based foods and high quality protein as part of nutritional weight management. Was counseled on pharmacotherapy and role as an adjunct in weight management.   Obesity Education Performed Today:  She was weighed on the bioimpedance scale and results were discussed and documented in the synopsis.  We discussed obesity as a disease and the importance of a more detailed evaluation of all the factors contributing to the disease.  We discussed the importance of long term lifestyle changes which include nutrition, exercise and behavioral modifications as well as the importance of customizing this to her specific health and social needs.  We discussed the benefits of reaching a healthier weight to alleviate the symptoms of existing conditions and reduce the risks of the biomechanical, metabolic and psychological effects of obesity.  Kirsten Poole appears to be in the action stage of change and states they are ready to start intensive lifestyle modifications and behavioral modifications.  30 minutes was spent today on this visit including the above counseling, pre-visit chart review, and post-visit documentation.  Reviewed by clinician on day of visit: allergies, medications, problem list, medical history, surgical history, family history, social history, and previous encounter notes pertinent to obesity diagnosis.  Kirsten Garms d. Richetta Cubillos, NP-C

## 2023-02-17 ENCOUNTER — Other Ambulatory Visit: Payer: Self-pay | Admitting: Obstetrics

## 2023-02-17 DIAGNOSIS — Z30430 Encounter for insertion of intrauterine contraceptive device: Secondary | ICD-10-CM

## 2023-02-17 MED ORDER — MISOPROSTOL 200 MCG PO TABS
200.0000 ug | ORAL_TABLET | Freq: Once | ORAL | 0 refills | Status: DC
Start: 2023-02-17 — End: 2023-02-25

## 2023-02-23 ENCOUNTER — Ambulatory Visit (INDEPENDENT_AMBULATORY_CARE_PROVIDER_SITE_OTHER): Payer: BC Managed Care – PPO | Admitting: Family Medicine

## 2023-02-23 ENCOUNTER — Encounter (INDEPENDENT_AMBULATORY_CARE_PROVIDER_SITE_OTHER): Payer: Self-pay | Admitting: Family Medicine

## 2023-02-23 VITALS — BP 121/85 | HR 84 | Temp 97.9°F | Ht 63.0 in | Wt 232.0 lb

## 2023-02-23 DIAGNOSIS — R5383 Other fatigue: Secondary | ICD-10-CM | POA: Diagnosis not present

## 2023-02-23 DIAGNOSIS — I10 Essential (primary) hypertension: Secondary | ICD-10-CM | POA: Diagnosis not present

## 2023-02-23 DIAGNOSIS — K219 Gastro-esophageal reflux disease without esophagitis: Secondary | ICD-10-CM

## 2023-02-23 DIAGNOSIS — Z1331 Encounter for screening for depression: Secondary | ICD-10-CM

## 2023-02-23 DIAGNOSIS — Z6841 Body Mass Index (BMI) 40.0 and over, adult: Secondary | ICD-10-CM

## 2023-02-23 DIAGNOSIS — F5089 Other specified eating disorder: Secondary | ICD-10-CM

## 2023-02-23 DIAGNOSIS — R0602 Shortness of breath: Secondary | ICD-10-CM

## 2023-02-23 NOTE — Progress Notes (Signed)
Kirsten Poole, D.O.  ABFM, ABOM Specializing in Clinical Bariatric Medicine Office located at: [address] [place], [place]  [address]     Bariatric Medicine Visit  Dear Kirsten Poole, Kirsten Sevin, FNP or Kirsten Salines, FNP,   Thank you for referring Kirsten Poole to our clinic today for evaluation.  We performed a consultation to discuss her options for treatment and educate the patient on her disease state.  The following note includes my evaluation and treatment recommendations.   Please do not hesitate to reach out to me directly if you have any further concerns.    Assessment and Plan:   Orders Placed This Encounter  Procedures   Folate   Insulin, random   T4, free   Vitamin B12   VITAMIN D 25 Hydroxy (Vit-D Deficiency, Fractures)    1) Fatigue Assessment: Condition is Not optimized.  Kirsten Poole does feel that her weight is causing her energy to be lower than it should be. Fatigue may be related to obesity, depression or many other causes. she does not appear to have any red flag symptoms and this appears to most likely related to her current lifestyle habits and dietary intake.  Plan:  Labs will be ordered and reviewed with her at their next office visit in two weeks. Epworth sleepiness scale score appears to be within normal limits.  Her ESS score is 4. Kirsten Poole denies daytime somnolence and admits to waking up still tired.  Kirsten Poole generally gets  3-6   hours of sleep per night, and states that she has generally restful sleep. Snoring is not present. Apneic episodes is not present.  I encouraged patient to aim for 7-9 hrs of sleep per night for the best weight loss results and for her overall well-being.  ECG: Normal sinus rhythm with slight LAD, rate 82 bpm; reassuring without any acute abnormalities, will continue to monitor for symptoms . Done on 12/31/22 Depression screen [Z13.31]/Modified PHQ-9 Depression Screen: Her Food and Mood (modified PHQ-9) score was 11 .  Patient denies having any symtoms of depression. In the meanwhile, Kirsten Poole will focus on self care including making healthy food choices by following their meal plan, improving sleep quality and focusing on stress reduction.  Once we are assured she is on an appropriate meal plan, we will start discussing exercise to increase cardiovascular fitness levels.    2) Shortness of breath on exertion Assessment: Condition is Not optimized. Kirsten Poole does feel that she gets out of breath more easily than she used to when she exercises and seems to be worsening over time with weight gain.  This has gotten worse recently. Kirsten Poole denies shortness of breath at rest or orthopnea. Kirsten Poole's shortness of breath appears to be obesity related and exercise induced, as they do not appear to have any "red flag" symptoms/ concerns today.  Also, this condition appears to be related to a state of poor cardiovascular conditioning   Plan:  Obtain labs today and will be reviewed with her at their next office visit in two weeks. Indirect Calorimeter completed today to help guide our dietary regimen. It shows a VO2 of 251 and a REE of 1728.  Her calculated basal metabolic rate is 6045 thus her measured basal metabolic rate is better than expected. Patient agreed to work on weight loss at this time.  As Kirsten Poole progresses through our weight loss program, we will gradually increase exercise as tolerated to treat her current condition.   If Kirsten Poole follows our  recommendations and loses 5-10% of their weight without improvement of her shortness of breath or if at any time, symptoms become more concerning, they agree to urgently follow up with their PCP/ specialist for further consideration/ evaluation.   Kirsten Poole verbalizes agreement with this plan.    Primary hypertension Assessment: Kirsten Poole endorses having a  family hx of hypertension on her maternal side. Blood pressure is stable today. No concerns. [name] has been compliant and tolerant with  Hydrodiuril 12.5 mg daily. She has been on this medication for 2 months. Denies any adverse effects.   Last 3 blood pressure readings in our office are as follows: BP Readings from Last 3 Encounters:  02/23/23 121/85  02/03/23 122/89  01/28/23 128/70   Plan: Check labs.Continue with antihypertensive medication at current dose. Begin Prudent nutritional plan and low sodium diet, advance exercise as tolerated.  We will begin to monitor symptoms as they relate to the her weight loss journey.    Gastroesophageal reflux disease, unspecified whether esophagitis present Assessment: This condition is mostly diet and life style controlled. Patient endorses that her GERD symptoms are well controlled when she avoids trigger foods and with OTC Prilosec prn. No concerns.  Plan: Check labs.  Lifestyle modifications are the first line treatment for this issue. Some of these include not eating within 2-3 hours of lying down, avoiding trigger foods, decrease caffeine intake, decrease abdominal girth.  Kirsten Poole will begin to work on diet, exercise and weight loss efforts.  Will begin to monitor condition as deemed clinically necessary.   Other disorder of eating - Emotional Eating Assessment: Condition is stable.Denies any SI/HI. Denies any symptoms of depression. Mood is stable. Patient endorses occasionally eating when stressed, bored, and as a reward. Overall, she feels that her emotional eating is well controlled.   Plan:I informed patient about Dr. Dewaine Conger, our Bariatric Psychologist, who can help [name] with emotional eating. Patient denies a referall today, but may consider meeting with her in the future.   Start  her prudent nutritional plan that is low in simple carbohydrates, saturated fats and trans fats to goal of 5-10% weight loss to achieve significant health benefits.We will begin to monitor closely alongside PCP / other specialists.    TREATMENT PLAN FOR OBESITY: Morbid obesity with  BMI of 40.0-44.9, adult (HCC), Starting BMI 40.8 Assessment: Condition is improving.  Biometric data collected today, was reviewed with patient.  Since information session on 02/03/23, Muscle mass has increased by 2.6 lb. Fat mass has decreased by 0.4 lb.Total body water has decreased by 2 lb.   Plan:  Begin journaling 1200-1300 calories and 90+ grams protein daily using the Category 2 meal plan with breakfast and lunch options and only 100 snack calories as a guide.   Behavioral Intervention Additional resources provided today: category 2 meal plan information, Food journaling plan information, breakfast options, and lunch options Evidence-based interventions for health behavior change were utilized today including the discussion of self monitoring techniques, problem-solving barriers and SMART goal setting techniques.   Regarding patient's less desirable eating habits and patterns, we employed the technique of small changes.  Pt will specifically work on:  journaling intake and bringing food log for next visit.    Recommended Physical Activity Goals Jamala has been advised to gradually work up to 150 minutes of moderate intensity aerobic activity a week and strengthening exercises 2-3 times per week for cardiovascular health, weight loss maintenance and preservation of muscle mass.  She has agreed to continue their  current level of activity  FOLLOW UP: Follow up in 2 weeks. She was informed of the importance of frequent follow up visits to maximize her success with intensive lifestyle modifications for her multiple health conditions.  Kirsten Poole is aware that we will review all of her lab results at our next visit.  She is aware that if anything is critical/ life threatening with the results, we will be contacting her via MyChart prior to the office visit to discuss management.      Chief Complaint:   OBESITY Kirsten Poole (MR# [id number]) is a 47 y.o. female who presents for  evaluation and treatment of obesity and related comorbidities. Current BMI is Body mass index is 41.1 kg/m. Natayla has been struggling with her weight for many years and has been unsuccessful in either losing weight, maintaining weight loss, or reaching her healthy weight goal.  Kirsten Poole is currently in the action stage of change and ready to dedicate time achieving and maintaining a healthier weight. Sunni is interested in becoming our patient and working on intensive lifestyle modifications including (but not limited to) diet and exercise for weight loss.  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 Patient is married to [place]  and has 3 sons She lives with her spouse, 3 sons (45 y.o - 30 y.o) and 30 y.o niece.   Kirsten Poole B Custis's habits were reviewed today and are as follows:   - She attributes eating excess carbohydrates as the main reason she gained weight.   - Has tried a low carb diet in the past and it caused no weight loss.   -  In 2010, she was prescribed Adipex-P per her PCP and lost 30 lbs. She regained 15 lbs quickly after stopping the med.   -  She desires to be less than 180 lbs in a year from now.   - She eats outside the home 2-3 x a week.   - Only likes to cook on weekends.  - Craves and snacks on ice-cream, chips, and soda. She typically has these cravings after dinner.  - Drinks caloric beverages such as sweat tea and regular soda.  - Drinks alcohol (wine or bourbon) 1-2 x per month.    - Worst food habit: eating ice-cream.  - She skips breakfast 4-5 x a week.  - Had TSH, A1c, Lipid Panel, CMP, and CBC with PCP on 12/31/2022.     Subjective:   This is the patient's first visit at [facility].  The patient's NEW PATIENT PACKET that they filled out prior to today's office visit was reviewed at length and information from that paperwork was included within the  following office visit note.    Included in the packet: current and past health history, medications, allergies, ROS, gynecologic history (women only), surgical history, family history, social history, weight history, weight loss surgery history (for those that have had weight loss surgery), nutritional evaluation, mood and food questionnaire along with a depression screening (PHQ9) on all patients, an Epworth questionnaire, sleep habits questionnaire, patient life and health improvement goals questionnaire. These will all be scanned into the patient's chart under the "media" tab.   Review of Systems: Please refer to new patient packet scanned into media. Pertinent positives were addressed with patient today.  Reviewed by clinician on day of visit: allergies, medications, problem list, medical history, surgical history, family  history, social history, and previous encounter notes.  During the visit, I independently reviewed the patient's EKG, bioimpedance scale results, and indirect calorimeter results. I used this information to tailor a meal plan for the patient that will help Kirsten Poole to lose weight and will improve her obesity-related conditions going forward.  I performed a medically necessary appropriate examination and/or evaluation. I discussed the assessment and treatment plan with the patient. The patient was provided an opportunity to ask questions and all were answered. The patient agreed with the plan and demonstrated an understanding of the instructions. Labs were ordered today (unless patient declined them) and will be reviewed with the patient at our next visit unless more critical results need to be addressed immediately. Clinical information was updated and documented in the EMR.  Time spent on visit including pre-visit chart review and post-visit care was estimated to be 60  minutes. Over 50% of the time was spent in direct face to face counseling and coordination of  care. Objective:   PHYSICAL EXAM: Blood pressure 121/85, pulse 84, temperature 97.9 F (36.6 C), height 5\' 3"  (1.6 m), weight 232 lb (105.2 kg), SpO2 97 %. Body mass index is 41.1 kg/m. General: Well Developed, well nourished, and in no acute distress.  HEENT: Normocephalic, atraumatic Skin: Warm and dry, cap RF less 2 sec, good turgor Chest:  Normal excursion, shape, no gross abn Respiratory: speaking in full sentences, no conversational dyspnea NeuroM-Sk: Ambulates w/o assistance, moves * 4 Psych: A and O *3, insight good, mood-full  Anthropometric Measurements Height: 5\' 3"  (1.6 m) Weight: 232 lb (105.2 kg) BMI (Calculated): 41.11 Starting Weight: 232lb Peak Weight: 240lb   Body Composition  Body Fat %: 48.3 % Fat Mass (lbs): 112.4 lbs Muscle Mass (lbs): 114.4 lbs Total Body Water (lbs): 85.2 lbs Visceral Fat Rating : 14   Other Clinical Data RMR: [id number] Today's Visit #: first Starting Date: 02/23/23    DIAGNOSTIC DATA REVIEWED:  BMET    Component Value Date/Time   NA 141 12/31/2022 1550   NA 138 12/23/2015 1146   K 4.3 12/31/2022 1550   CL 104 12/31/2022 1550   CO2 26 12/31/2022 1550   GLUCOSE 81 12/31/2022 1550   BUN 17 12/31/2022 1550   BUN 14 12/23/2015 1146   CREATININE 1.14 (H) 12/31/2022 1550   CALCIUM 9.5 12/31/2022 1550   GFRNONAA >60 10/19/2020 1140   GFRAA 112 12/23/2015 1146   Lab Results  Component Value Date   HGBA1C 5.7 (H) 12/31/2022   HGBA1C 5.6 12/23/2015   No results found for: "INSULIN" Lab Results  Component Value Date   TSH 1.57 12/31/2022   CBC    Component Value Date/Time   WBC 8.7 12/31/2022 1550   RBC 4.82 12/31/2022 1550   HGB 13.5 12/31/2022 1550   HCT 40.5 12/31/2022 1550   PLT 401 (H) 12/31/2022 1550   MCV 84.0 12/31/2022 1550   MCH 28.0 12/31/2022 1550   MCHC 33.3 12/31/2022 1550   RDW 13.3 12/31/2022 1550   Iron Studies No results found for: "IRON", "TIBC", "FERRITIN", "IRONPCTSAT" Lipid Panel      Component Value Date/Time   CHOL 168 12/31/2022 1550   CHOL 149 12/23/2015 1146   TRIG 202 (H) 12/31/2022 1550   HDL 43 (L) 12/31/2022 1550   HDL 43 12/23/2015 1146   CHOLHDL 3.9 12/31/2022 1550   LDLCALC 96 12/31/2022 1550   Hepatic Function Panel     Component Value Date/Time   PROT 6.6  12/31/2022 1550   PROT 6.7 12/23/2015 1146   ALBUMIN 3.8 10/19/2020 1140   ALBUMIN 4.1 12/23/2015 1146   AST 16 12/31/2022 1550   ALT 16 12/31/2022 1550   ALKPHOS 59 10/19/2020 1140   BILITOT 0.3 12/31/2022 1550   BILITOT 0.3 12/23/2015 1146      Component Value Date/Time   TSH 1.57 12/31/2022 1550   Nutritional No results found for: "VD25OH"  Attestation Statements:   I, Special Puri, acting as a Stage manager for [employer], DO., have compiled all relevant documentation for today's office visit on behalf of Thomasene Lot, DO, while in the presence of [employer], DO.  Reviewed by clinician on day of visit: allergies, medications, problem list, medical history, surgical history, family history, social history, and previous encounter notes pertinent to patient's obesity diagnosis.    This encounter took 45+ total minutes of time including time coordinating the above treatment plan, any pre-visit chart review and post-visit documentation and reviewing any labs and/or imaging, reviewing pertinent prior notes, and providing counseling the patient about such treatment plan.  Approximately 50% of this time was spent in direct, face-to-face counseling and coordination of care.   I have reviewed the above documentation for accuracy and completeness, and I agree with the above. Kirsten Poole, D.O.  The 21st Century Cures Act was signed into law in 2016 which includes the topic of electronic health records.  This provides immediate access to information in MyChart.  This includes consultation notes, operative notes, office notes, lab results and pathology reports.  If you have any  questions about what you read please let us know at your next visit so we can discuss your concerns and take corrective action if need be.  We are right here with you.

## 2023-02-24 LAB — VITAMIN D 25 HYDROXY (VIT D DEFICIENCY, FRACTURES): Vit D, 25-Hydroxy: 17.9 ng/mL — ABNORMAL LOW (ref 30.0–100.0)

## 2023-02-24 LAB — VITAMIN B12: Vitamin B-12: 416 pg/mL (ref 232–1245)

## 2023-02-24 LAB — FOLATE: Folate: 5.5 ng/mL (ref >3.0)

## 2023-02-24 LAB — T4, FREE: Free T4: 1.15 ng/dL (ref 0.82–1.77)

## 2023-02-24 LAB — INSULIN, RANDOM: INSULIN: 28.1 u[IU]/mL — ABNORMAL HIGH (ref 2.6–24.9)

## 2023-02-24 NOTE — Progress Notes (Signed)
Name: Kirsten Poole   MRN: [id number]    DOB: 1976-04-10   Date:02/25/2023       Progress Note  Subjective  Chief Complaint  Chief Complaint  Patient presents with   Annual Exam    HPI  Patient presents for annual CPE.  Diet: [facility] Exercise: 5 days a week 10 minutes  Last Eye Exam: 5 years  Last Dental Exam: March 2024  Flowsheet Row Office Visit from 02/25/2023 in [facility]  AUDIT-C Score 1      Depression: Phq 9 is  negative    02/25/2023    1:00 PM 01/28/2023    2:58 PM 12/31/2022    3:19 PM  Depression screen PHQ 2/9  Decreased Interest 0 0 0  Down, Depressed, Hopeless 0 0 0  PHQ - 2 Score 0 0 0  Altered sleeping   0  Tired, decreased energy   0  Change in appetite   0  Feeling bad or failure about yourself    0  Trouble concentrating   0  Moving slowly or fidgety/restless   0  Suicidal thoughts   0  PHQ-9 Score   0  Difficult doing work/chores   Not difficult at all   Hypertension: BP Readings from Last 3 Encounters:  02/25/23 126/82  02/25/23 116/78  02/23/23 121/85   Obesity: Wt Readings from Last 3 Encounters:  02/25/23 235 lb (106.6 kg)  02/25/23 235 lb (106.6 kg)  02/23/23 232 lb (105.2 kg)   BMI Readings from Last 3 Encounters:  02/25/23 41.63 kg/m  02/25/23 41.63 kg/m  02/23/23 41.10 kg/m     Vaccines:   HPV: up to at age 74 , ask insurance if age between 47-45  Shingrix: 31-64 yo and ask insurance if covered when patient above 44 yo Pneumonia:  educated and discussed with patient. Flu: 2023  educated and discussed with patient.  Hep C Screening: 12/31/2022 STD testing and prevention (HIV/chl/gon/syphilis): 12/31/2022 Intimate partner violence: negative screen  Sexual History :yes Menstrual History/LMP/Abnormal Bleeding: 2015 Discussed importance of follow up if any post-menopausal bleeding: yes  Incontinence Symptoms: negative for symptoms   Breast cancer:  - Last Mammogram:  ordered - BRCA gene screening: none  Osteoporosis Prevention : Discussed high calcium and vitamin D supplementation, weight bearing exercises Bone density :not applicable   Cervical cancer screening: 01/28/2023  Skin cancer: Discussed monitoring for atypical lesions  Colorectal cancer: no concerns, does not qualify   Lung cancer:  Low Dose CT Chest recommended if Age 67-80 years, 20 pack-year currently smoking OR have quit w/in 15years. Patient does not qualify for screen   ECG:   Advanced Care Planning: A voluntary discussion about advance care planning including the explanation and discussion of advance directives.  Discussed health care proxy and Living will, and the patient was able to identify a health care proxy as husband.  Patient does not have a living will and power of attorney of health care   Lipids: Lab Results  Component Value Date   CHOL 168 12/31/2022   CHOL 149 12/23/2015   Lab Results  Component Value Date   HDL 43 (L) 12/31/2022   HDL 43 12/23/2015   Lab Results  Component Value Date   LDLCALC 96 12/31/2022   LDLCALC 81 12/23/2015   Lab Results  Component Value Date   TRIG 202 (H) 12/31/2022   TRIG 124 12/23/2015   Lab Results  Component Value Date   CHOLHDL  3.9 12/31/2022   CHOLHDL 3.5 12/23/2015   No results found for: "LDLDIRECT"  Glucose: Glucose  Date Value Ref Range Status  12/23/2015 101 (H) 65 - 99 mg/dL Final   Glucose, Bld  Date Value Ref Range Status  12/31/2022 81 65 - 99 mg/dL Final    Comment:    .            Fasting reference interval .   10/19/2020 98 70 - 99 mg/dL Final    Comment:    Glucose reference range applies only to samples taken after fasting for at least 8 hours.  03/23/2013 80 70 - 99 mg/dL Final    Patient Active Problem List   Diagnosis Date Noted   IUD (intrauterine device) in place 02/25/2023   Primary hypertension 12/31/2022   Tachycardia 12/31/2022   Varicose vein of leg 05/16/2015   Morbid obesity  with BMI of 40.0-44.9, adult (HCC) 04/08/2015    Past Surgical History:  Procedure Laterality Date   WISDOM TOOTH EXTRACTION      Family History  Problem Relation Age of Onset   Diabetes Mother    Hypertension Mother    Thyroid disease Mother    Obesity Mother    Atrial fibrillation Father    Stroke Father    Hypertension Father    Hyperlipidemia Father    Heart disease Father    Thyroid disease Sister    Thyroid disease Maternal Aunt    Arthritis Maternal Grandmother    Hyperlipidemia Maternal Grandmother    Heart disease Maternal Grandmother    Hypertension Maternal Grandmother    Diabetes Maternal Grandmother    Heart disease Maternal Grandfather        MI   Hypertension Maternal Grandfather    Arthritis Paternal Grandmother     Social History   Socioeconomic History   Marital status: Married    Spouse name: [place] Kloc   Number of children: 3   Years of education: 14   Highest education level: Not on file  Occupational History   Occupation: Surveyor, minerals: [employer]    Comment: [employer]  Tobacco Use   Smoking status: Never   Smokeless tobacco: Never  Vaping Use   Vaping Use: Never used  Substance and Sexual Activity   Alcohol use: No   Drug use: No   Sexual activity: Yes    Birth control/protection: None  Other Topics Concern   Not on file  Social History Narrative   Cattie grew up in [place]. She lives at home with her husband of 15 years that their 3 children ([name], [name], [name]). They have a dog and cat. 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 She attended [employer] for her pre-requisites in nursing but realized this was not what she wanted to do. She is very busy with her 3 boys and baseball.    Social Determinants of Health   Financial Resource Strain: Low Risk  (02/25/2023)   Overall Financial Resource Strain (CARDIA)    Difficulty of Paying Living Expenses:  Not hard at all  Food Insecurity: No Food Insecurity (02/25/2023)   Hunger Vital Sign    Worried About Running Out of Food in the Last Year: Never true    Ran Out of Food in the Last Year: Never true  Transportation Needs: No Transportation Needs (02/25/2023)   PRAPARE - Administrator, Civil Service (Medical): No  Lack of Transportation (Non-Medical): No  Physical Activity: Insufficiently Active (02/25/2023)   Exercise Vital Sign    Days of Exercise per Week: 5 days    Minutes of Exercise per Session: 10 min  Stress: No Stress Concern Present (02/25/2023)   [employer] of Occupational Health - Occupational Stress Questionnaire    Feeling of Stress : Only a little  Social Connections: Moderately Integrated (02/25/2023)   Social Connection and Isolation Panel [NHANES]    Frequency of Communication with Friends and Family: More than three times a week    Frequency of Social Gatherings with Friends and Family: Three times a week    Attends Religious Services: More than 4 times per year    Active Member of Clubs or Organizations: No    Attends Banker Meetings: Never    Marital Status: Married  Catering manager Violence: Not At Risk (02/25/2023)   Humiliation, Afraid, Rape, and Kick questionnaire    Fear of Current or Ex-Partner: No    Emotionally Abused: No    Physically Abused: No    Sexually Abused: No     Current Outpatient Medications:    hydrochlorothiazide (HYDRODIURIL) 12.5 MG tablet, Take 1 tablet (12.5 mg total) by mouth daily., Disp: 90 tablet, Rfl: 1   levonorgestrel (MIRENA) 20 MCG/DAY IUD, 1 each by Intrauterine route once., Disp: , Rfl:   Allergies  Allergen Reactions   Other     Other reaction(s): Other (See Comments) Seasonal outside allergies (sneezing)     ROS  Constitutional: Negative for fever or weight change.  Respiratory: Negative for cough and shortness of breath.   Cardiovascular: Negative for chest pain or palpitations.   Gastrointestinal: Negative for abdominal pain, no bowel changes.  Musculoskeletal: Negative for gait problem or joint swelling.  Skin: Negative for rash.  Neurological: Negative for dizziness or headache.  No other specific complaints in a complete review of systems (except as listed in HPI above).   Objective  Vitals:   02/25/23 1255  BP: 126/82  Pulse: 96  Resp: 16  Temp: 98.1 F (36.7 C)  TempSrc: Oral  Weight: 235 lb (106.6 kg)  Height: 5\' 3"  (1.6 m)    Body mass index is 41.63 kg/m.  Physical Exam Constitutional: Patient appears well-developed and well-nourished. No distress.  HENT: Head: Normocephalic and atraumatic. Ears: B TMs ok, no erythema or effusion; Nose: Nose normal. Mouth/Throat: Oropharynx is clear and moist. No oropharyngeal exudate.  Eyes: Conjunctivae and EOM are normal. Pupils are equal, round, and reactive to light. No scleral icterus.  Neck: Normal range of motion. Neck supple. No JVD present. No thyromegaly present.  Cardiovascular: Normal rate, regular rhythm and normal heart sounds.  No murmur heard. No BLE edema. Pulmonary/Chest: Effort normal and breath sounds normal. No respiratory distress. Abdominal: Soft. Bowel sounds are normal, no distension. There is no tenderness. no masses Musculoskeletal: Normal range of motion, no joint effusions. No gross deformities Neurological: he is alert and oriented to person, place, and time. No cranial nerve deficit. Coordination, balance, strength, speech and gait are normal.  Skin: Skin is warm and dry. No rash noted. No erythema.  Psychiatric: Patient has a normal mood and affect. behavior is normal. Judgment and thought content normal.   Recent Results (from the past 2160 hour(s))  HIV antibody (with reflex)     Status: None   Collection Time: 12/31/22  3:50 PM  Result Value Ref Range   HIV 1&2 Ab, 4th Generation NON-REACTIVE NON-REACTIVE  Comment: HIV-1 antigen and HIV-1/HIV-2 antibodies were  not detected. There is no laboratory evidence of HIV infection. Marland Kitchen PLEASE NOTE: This information has been disclosed to you from records whose confidentiality may be protected by state law.  If your state requires such protection, then the state law prohibits you from making any further disclosure of the information without the specific written consent of the person to whom it pertains, or as otherwise permitted by law. A general authorization for the release of medical or other information is NOT sufficient for this purpose. . For additional information please refer to http://education.[facility].com/faq/FAQ106 (This link is being provided for informational/ educational purposes only.) . Marland Kitchen The performance of this assay has not been clinically validated in patients less than 58 years old. .   Hepatitis C Antibody     Status: None   Collection Time: 12/31/22  3:50 PM  Result Value Ref Range   Hepatitis C Ab NON-REACTIVE NON-REACTIVE    Comment: . HCV antibody was non-reactive. There is no laboratory  evidence of HCV infection. . In most cases, no further action is required. However, if recent HCV exposure is suspected, a test for HCV RNA (test code 16109) is suggested. . For additional information please refer to http://education.[facility].com/faq/FAQ22v1 (This link is being provided for informational/ educational purposes only.) .   CBC with Differential/Platelet     Status: Abnormal   Collection Time: 12/31/22  3:50 PM  Result Value Ref Range   WBC 8.7 3.8 - 10.8 Thousand/uL   RBC 4.82 3.80 - 5.10 Million/uL   Hemoglobin 13.5 11.7 - 15.5 g/dL   HCT 60.4 54.0 - 98.1 %   MCV 84.0 80.0 - 100.0 fL   MCH 28.0 27.0 - 33.0 pg   MCHC 33.3 32.0 - 36.0 g/dL   RDW 19.1 47.8 - 29.5 %   Platelets 401 (H) 140 - 400 Thousand/uL   MPV 10.4 7.5 - 12.5 fL   Neutro Abs 5,786 1,500 - 7,800 cells/uL   Lymphs Abs 2,192 850 - 3,900 cells/uL   Absolute Monocytes 487 200 -  950 cells/uL   Eosinophils Absolute 174 15 - 500 cells/uL   Basophils Absolute 61 0 - 200 cells/uL   Neutrophils Relative % 66.5 %   Total Lymphocyte 25.2 %   Monocytes Relative 5.6 %   Eosinophils Relative 2.0 %   Basophils Relative 0.7 %  COMPLETE METABOLIC PANEL WITH GFR     Status: Abnormal   Collection Time: 12/31/22  3:50 PM  Result Value Ref Range   Glucose, Bld 81 65 - 99 mg/dL    Comment: .            Fasting reference interval .    BUN 17 7 - 25 mg/dL   Creat 6.21 (H) 3.08 - 0.99 mg/dL   eGFR 61 > OR = 60 MV/HQI/6.96E9   BUN/Creatinine Ratio 15 6 - 22 (calc)   Sodium 141 135 - 146 mmol/L   Potassium 4.3 3.5 - 5.3 mmol/L   Chloride 104 98 - 110 mmol/L   CO2 26 20 - 32 mmol/L   Calcium 9.5 8.6 - 10.2 mg/dL   Total Protein 6.6 6.1 - 8.1 g/dL   Albumin 4.2 3.6 - 5.1 g/dL   Globulin 2.4 1.9 - 3.7 g/dL (calc)   AG Ratio 1.8 1.0 - 2.5 (calc)   Total Bilirubin 0.3 0.2 - 1.2 mg/dL   Alkaline phosphatase (APISO) 81 31 - 125 U/L   AST 16 10 - 30 U/L  ALT 16 6 - 29 U/L  Lipid panel     Status: Abnormal   Collection Time: 12/31/22  3:50 PM  Result Value Ref Range   Cholesterol 168 <200 mg/dL   HDL 43 (L) > OR = 50 mg/dL   Triglycerides 865 (H) <150 mg/dL    Comment: . If a non-fasting specimen was collected, consider repeat triglyceride testing on a fasting specimen if clinically indicated.  Perry Mount et al. J. of Clin. Lipidol. 2015;9:129-169. Marland Kitchen    LDL Cholesterol (Calc) 96 mg/dL (calc)    Comment: Reference range: <100 . Desirable range <100 mg/dL for primary prevention;   <70 mg/dL for patients with CHD or diabetic patients  with > or = 2 CHD risk factors. Marland Kitchen LDL-C is now calculated using the Martin-Hopkins  calculation, which is a validated novel method providing  better accuracy than the Friedewald equation in the  estimation of LDL-C.  Horald Pollen et al. Lenox Ahr. 7846;962(95): 2061-2068  (http://education.[facility].com/faq/FAQ164)    Total CHOL/HDL  Ratio 3.9 <5.0 (calc)   Non-HDL Cholesterol (Calc) 125 <130 mg/dL (calc)    Comment: For patients with diabetes plus 1 major ASCVD risk  factor, treating to a non-HDL-C goal of <100 mg/dL  (LDL-C of <28 mg/dL) is considered a therapeutic  option.   Hemoglobin A1c     Status: Abnormal   Collection Time: 12/31/22  3:50 PM  Result Value Ref Range   Hgb A1c MFr Bld 5.7 (H) <5.7 % of total Hgb    Comment: For someone without known diabetes, a hemoglobin  A1c value between 5.7% and 6.4% is consistent with prediabetes and should be confirmed with a  follow-up test. . For someone with known diabetes, a value <7% indicates that their diabetes is well controlled. A1c targets should be individualized based on duration of diabetes, age, comorbid conditions, and other considerations. . This assay result is consistent with an increased risk of diabetes. . Currently, no consensus exists regarding use of hemoglobin A1c for diagnosis of diabetes for children. .    Mean Plasma Glucose 117 mg/dL   eAG (mmol/L) 6.5 mmol/L    Comment: . This test was performed on the Roche cobas c503 platform. Effective 06/21/22, a change in test platforms from the Abbott Architect to the Roche cobas c503 may have shifted HbA1c results compared to historical results. Based on laboratory validation testing conducted at [facility], the Roche platform relative to the Abbott platform had an average increase in HbA1c value of < or = 0.3%. This difference is within accepted  variability established by the [facility]. Note that not all individuals will have had a shift in their results and direct comparisons between historical and current results for testing conducted on different platforms is not recommended.   TSH     Status: None   Collection Time: 12/31/22  3:50 PM  Result Value Ref Range   TSH 1.57 mIU/L    Comment:           Reference Range .           > or = 20 Years   0.40-4.50 .                Pregnancy Ranges           First trimester    0.26-2.66           Second trimester   0.55-2.73           Third trimester  0.43-2.91   Cytology - PAP     Status: None   Collection Time: 01/28/23  1:32 PM  Result Value Ref Range   High risk HPV Negative    Adequacy      Satisfactory for evaluation; transformation zone component PRESENT.   Diagnosis      - Negative for intraepithelial lesion or malignancy (NILM)   Comment Normal Reference Range HPV - Negative   Folate     Status: None   Collection Time: 02/23/23  9:26 AM  Result Value Ref Range   Folate 5.5 >3.0 ng/mL    Comment: A serum folate concentration of less than 3.1 ng/mL is considered to represent clinical deficiency.   Insulin, random     Status: Abnormal   Collection Time: 02/23/23  9:26 AM  Result Value Ref Range   INSULIN 28.1 (H) 2.6 - 24.9 uIU/mL  T4, free     Status: None   Collection Time: 02/23/23  9:26 AM  Result Value Ref Range   Free T4 1.15 0.82 - 1.77 ng/dL  Vitamin W11     Status: None   Collection Time: 02/23/23  9:26 AM  Result Value Ref Range   Vitamin B-12 416 232 - 1,245 pg/mL  VITAMIN D 25 Hydroxy (Vit-D Deficiency, Fractures)     Status: Abnormal   Collection Time: 02/23/23  9:26 AM  Result Value Ref Range   Vit D, 25-Hydroxy 17.9 (L) 30.0 - 100.0 ng/mL    Comment: Vitamin D deficiency has been defined by the Institute of Medicine and an Endocrine Society practice guideline as a level of serum 25-OH vitamin D less than 20 ng/mL (1,2). The Endocrine Society went on to further define vitamin D insufficiency as a level between 21 and 29 ng/mL (2). 1. IOM (Institute of Medicine). 2010. Dietary reference    intakes for calcium and D. [place]: The    [employer]. 2. Holick MF, [address], Bischoff-Ferrari HA, et al.    Evaluation, treatment, and prevention of vitamin D    deficiency: an Endocrine Society clinical practice    guideline. JCEM. 2011  Jul; 96(7):1911-30.   POCT urine pregnancy     Status: Normal   Collection Time: 02/25/23 11:35 AM  Result Value Ref Range   Preg Test, Ur Negative Negative     Fall Risk:    02/25/2023    1:00 PM 01/28/2023    2:58 PM 12/31/2022    3:19 PM  Fall Risk   Falls in the past year? 0 0 0  Number falls in past yr: 0 0 0  Injury with Fall? 0 0 0     Functional Status Survey: Is the patient deaf or have difficulty hearing?: No Does the patient have difficulty seeing, even when wearing glasses/contacts?: No Does the patient have difficulty concentrating, remembering, or making decisions?: No Does the patient have difficulty walking or climbing stairs?: No Does the patient have difficulty dressing or bathing?: No   Assessment & Plan  1. Annual physical exam Continue working on lifestyle modification, including eating a well balanced diet Increase physical activity as tolerated.  Labs were recently done Pt up to date on pap She is going to call and schedule mammogram  -USPSTF grade A and B recommendations reviewed with patient; age-appropriate recommendations, preventive care, screening tests, etc discussed and encouraged; healthy living encouraged; see AVS for patient education given to patient -Discussed importance of 150 minutes of physical activity weekly, eat two servings of fish  weekly, eat one serving of tree nuts ( cashews, pistachios, pecans, almonds.Marland Kitchen) every other day, eat 6 servings of fruit/vegetables daily and drink plenty of water and avoid sweet beverages.   -Reviewed Health Maintenance: Yes.

## 2023-02-25 ENCOUNTER — Ambulatory Visit (INDEPENDENT_AMBULATORY_CARE_PROVIDER_SITE_OTHER): Payer: BC Managed Care – PPO | Admitting: Nurse Practitioner

## 2023-02-25 ENCOUNTER — Ambulatory Visit: Payer: BC Managed Care – PPO | Admitting: Obstetrics

## 2023-02-25 ENCOUNTER — Encounter: Payer: Self-pay | Admitting: Nurse Practitioner

## 2023-02-25 ENCOUNTER — Encounter: Payer: Self-pay | Admitting: Obstetrics

## 2023-02-25 ENCOUNTER — Other Ambulatory Visit: Payer: Self-pay

## 2023-02-25 VITALS — BP 116/78 | HR 71 | Ht 63.0 in | Wt 235.0 lb

## 2023-02-25 VITALS — BP 126/82 | HR 96 | Temp 98.1°F | Resp 16 | Ht 63.0 in | Wt 235.0 lb

## 2023-02-25 DIAGNOSIS — Z0000 Encounter for general adult medical examination without abnormal findings: Secondary | ICD-10-CM | POA: Diagnosis not present

## 2023-02-25 DIAGNOSIS — N912 Amenorrhea, unspecified: Secondary | ICD-10-CM

## 2023-02-25 DIAGNOSIS — Z30430 Encounter for insertion of intrauterine contraceptive device: Secondary | ICD-10-CM

## 2023-02-25 DIAGNOSIS — Z3202 Encounter for pregnancy test, result negative: Secondary | ICD-10-CM | POA: Diagnosis not present

## 2023-02-25 DIAGNOSIS — Z975 Presence of (intrauterine) contraceptive device: Secondary | ICD-10-CM | POA: Insufficient documentation

## 2023-02-25 LAB — POCT URINE PREGNANCY: Preg Test, Ur: NEGATIVE

## 2023-02-25 MED ORDER — LEVONORGESTREL 20 MCG/DAY IU IUD
1.0000 | INTRAUTERINE_SYSTEM | Freq: Once | INTRAUTERINE | Status: AC
Start: 2023-02-25 — End: 2023-02-25
  Administered 2023-02-25: 1 via INTRAUTERINE

## 2023-02-25 NOTE — Progress Notes (Signed)
Kirsten Poole presents for an IUD insertion. She was seen last month for her Annual exam and we removed her other IUD. She had discussed her husband getting  a vasectomy. She has since decided to get another IUD. She also took some Cytotec last evening in preparation for the IUD placement today.   Patient plans IUD, and will schedule soon.  Risks and benefits of IUD discussed including the risks of irregular bleeding, cramping, infection, malpositioning, expulsion or uterine perforation of the IUD (1:1000 placements)  which may require further procedures such as laparoscopy.  IUDs while effective at preventing pregnancy do not prevent transmission of sexually transmitted diseases and use of barrier methods for this purpose was discussed.  Low overall incidence of failure with 99.7% efficacy rate in typical use.  The patient has not contraindication to IUD placement.    GYNECOLOGY OFFICE PROCEDURE NOTE  Kirsten Poole is a 47 y.o. G3P0 here for Mirena IUD insertion. No GYN concerns.  Last pap smear was on 01/28/2023 and was normal.  The patient is currently using a recently removed IUD for contraception and her LMP is No LMP recorded. (Menstrual status: IUD)..  The indication for her IUD is contraception/cycle control.  IUD Insertion Procedure Note Patient identified, informed consent performed, consent signed.   Discussed risks of irregular bleeding, cramping, infection, malpositioning, expulsion or uterine perforation of the IUD (1:1000 placements)  which may require further procedure such as laparoscopy.  IUD while effective at preventing pregnancy do not prevent transmission of sexually transmitted diseases and use of barrier methods for this purpose was discussed. Time out was performed.  Urine pregnancy test negative.  Speculum placed in the vagina.  Cervix visualized.  Cleaned with Betadine x 2.  Grasped anteriorly with a single tooth tenaculum.  Uterus sounded to 7 cms cm. IUD placed per manufacturer's  recommendations.  Strings trimmed to 3 cm. Tenaculum was removed, good hemostasis noted.  Patient tolerated procedure well.   Patient was given post-procedure instructions.  She was advised to have backup contraception for one week.  Patient was also asked to check IUD strings periodically and follow up in 6 weeks for IUD check.  IUD insertion CPT 58300,  Skyla [id number] Mirena [id number] Liletta [id number] Paraguard [id number] Rutha Bouchard [id number] Modifer 25, plus Modifer 79 is done during a global billing visit   She will return for an IUD string check.  Mirna Mires, CNM  02/25/2023 12:03 PM

## 2023-03-09 ENCOUNTER — Ambulatory Visit
Admission: RE | Admit: 2023-03-09 | Discharge: 2023-03-09 | Disposition: A | Discharge status: Home / Self Care | Payer: BC Managed Care – PPO | Source: Ambulatory Visit | Attending: Nurse Practitioner | Admitting: Nurse Practitioner

## 2023-03-09 ENCOUNTER — Ambulatory Visit (INDEPENDENT_AMBULATORY_CARE_PROVIDER_SITE_OTHER): Payer: BC Managed Care – PPO | Admitting: Family Medicine

## 2023-03-09 ENCOUNTER — Encounter (INDEPENDENT_AMBULATORY_CARE_PROVIDER_SITE_OTHER): Payer: Self-pay | Admitting: Family Medicine

## 2023-03-09 VITALS — BP 118/83 | HR 75 | Temp 98.2°F | Ht 63.0 in | Wt 230.0 lb

## 2023-03-09 DIAGNOSIS — Z6841 Body Mass Index (BMI) 40.0 and over, adult: Secondary | ICD-10-CM

## 2023-03-09 DIAGNOSIS — Z1231 Encounter for screening mammogram for malignant neoplasm of breast: Secondary | ICD-10-CM | POA: Insufficient documentation

## 2023-03-09 DIAGNOSIS — R7303 Prediabetes: Secondary | ICD-10-CM

## 2023-03-09 DIAGNOSIS — E559 Vitamin D deficiency, unspecified: Secondary | ICD-10-CM | POA: Diagnosis not present

## 2023-03-09 DIAGNOSIS — E781 Pure hyperglyceridemia: Secondary | ICD-10-CM | POA: Diagnosis not present

## 2023-03-09 MED ORDER — VITAMIN D (ERGOCALCIFEROL) 1.25 MG (50000 UNIT) PO CAPS
50000.0000 [IU] | ORAL_CAPSULE | ORAL | 0 refills | Status: DC
Start: 2023-03-09 — End: 2023-03-28

## 2023-03-09 NOTE — Progress Notes (Signed)
Kirsten Poole, D.O.  ABFM, ABOM Clinical Bariatric Medicine Physician  Office located at: [address] [place], [place]  [address]     Assessment and Plan:   Meds ordered this encounter  Medications   Vitamin D, Ergocalciferol, (DRISDOL) 1.25 MG (50000 UNIT) CAPS capsule    Sig: Take 1 capsule (50,000 Units total) by mouth every 7 (seven) days.    Dispense:  4 capsule    Refill:  0     Prediabetes Assessment: Condition is new and not optimized.   Labs below from 02/23/2023 and with PCP on 12/31/2022 were reviewed with pt today and all questions were answered appropriately: Her A1c is consistent with the prediabetic range of 5.7-6.4. Her Insulin levels are 5.62 times normal. Her folate and TSH levels are within the recommended normal limits. Labs also indicate that Creatinine levels are elevated, which can be from dehydration and or pt's use of Advil/Aleve. Her B12 levels are not quite within the recommended range of 500+, however I anticipate these levels to increase as she continues to follow the meal plan.   Lab Results  Component Value Date   HGBA1C 5.7 (H) 12/31/2022   HGBA1C 5.6 12/23/2015   INSULIN 28.1 (H) 02/23/2023   Lab Results  Component Value Date   FOLATE 5.5 02/23/2023   Lab Results  Component Value Date   TSH 1.57 12/31/2022   FREET4 1.15 02/23/2023   Lab Results  Component Value Date   CREATININE 1.14 (H) 12/31/2022   BUN 17 12/31/2022   NA 141 12/31/2022   K 4.3 12/31/2022   CL 104 12/31/2022   CO2 26 12/31/2022      Component Value Date/Time   PROT 6.6 12/31/2022 1550   PROT 6.7 12/23/2015 1146   ALBUMIN 3.8 10/19/2020 1140   ALBUMIN 4.1 12/23/2015 1146   AST 16 12/31/2022 1550   ALT 16 12/31/2022 1550   ALKPHOS 59 10/19/2020 1140   BILITOT 0.3 12/31/2022 1550   BILITOT 0.3 12/23/2015 1146   Lab Results  Component Value Date   VITAMINB12 416 02/23/2023   Plan: I reminded pt to limit her usage of nephrotoxic substances  (Aleve/Advil) to protect her kidneys. Unless pre-existing renal or cardiopulmonary conditions exist which patient was told to limit their fluid intake by another provider, I recommended roughly one half of their weight in pounds, to be the approximate ounces of non-caloric, non-caffeinated beverages they should drink per day; including more if they are engaging in exercise.   I counseled patient on pathophysiology of the disease process of Prediabetes.Continue to decrease simple carbs/ sugars; increase fiber and proteins -> follow her meal plan.Explained role of simple carbs and insulin levels on hunger and cravings. In addition, we discussed Metformin which can help [place] in the management of this disease process as well as with weight loss.  Will consider starting one of these meds in future as we will focus on prudent nutritional plan at this time. Rhina will continue to work on weight loss, exercise, via their meal plan we devised to help decrease the risk of progressing to diabetes. We will recheck A1c and fasting insulin level in approximately 3 months from last check, or as deemed appropriate.    Vitamin D deficiency  Assessment: Condition is new and not optimized.   Labs below indicate that: Her Vitamin D levels are not within the recommended range of 50-80. Labs were reviewed with patient today and education provided on them.  All of the  patient's questions about them were answered    Lab Results  Component Value Date   VD25OH 17.9 (L) 02/23/2023   Plan: Begin Ergocalciferol 50K IU weekly   I discussed the importance of vitamin D to the patient's health and well-being as well as to their ability to lose weight. I reviewed possible symptoms of low Vitamin D:  low energy, depressed mood, muscle aches, joint aches, osteoporosis etc. with patient. It has been show that administration of vitamin D supplementation leads to improved satiety and a decrease in inflammatory markers.  Hence, low Vitamin D  levels may be linked to an increased risk of cardiovascular events and even increased risk of cancers- such as colon and breast. Weight loss will likely improve availability of vitamin D, thus encouraged Kirsten Poole to continue with meal plan and their weight loss efforts to further improve this condition.  Thus, we will need to monitor levels regularly (every 3-4 mo on average) to keep levels within normal limits and prevent over supplementation.   Hypertriglyceridemia  Assessment: Condition is new and diet controlled.   Labs with PCP from 12/31/2022 were reviewed and indicate that: Her Triglycerides are elevated at 202, LDL is < 100 and at goal, and HDL is in the low normal range. Her ASCVD score is reassuring, no concerns. Labs were reviewed with pt today and education provided on them and how the foods patient eats may influence these findings. All questions were answered about them.    Lab Results  Component Value Date   CHOL 168 12/31/2022   HDL 43 (L) 12/31/2022   LDLCALC 96 12/31/2022   TRIG 202 (H) 12/31/2022   CHOLHDL 3.9 12/31/2022   The 10-year ASCVD risk score (Arnett DK, et al., 2019) is: 1%   Values used to calculate the score:     Age: 18 years     Sex: Female     Is Non-Hispanic African American: No     Diabetic: No     Tobacco smoker: No     Systolic Blood Pressure: 118 mmHg     Is BP treated: Yes     HDL Cholesterol: 43 mg/dL     Total Cholesterol: 168 mg/dL   Plan: [name] agrees to continue our treatment plan of a heart-heathy, low cholesterol meal plan - Cardiovascular risk and specific lipid/LDL goals reviewed. - We extensively discussed several lifestyle modifications today and she will continue to work on diet, exercise and weight loss efforts.  - I stressed the importance that patient continue with our prudent nutritional plan that is low in saturated and trans fats, and low in fatty carbs to improve these numbers.  - We recommend: aerobic activity with  eventual goal of a minimum of 150+ min wk plus 2 days/ week of resistance or strength training.   - We will continue routine screening as patient continues to achieve health goals along their weight loss journey     TREATMENT PLAN FOR OBESITY: Morbid obesity with BMI of 40.0-44.9, adult (HCC), Starting BMI 40.8 Assessment: [name] is here to discuss her progress with her obesity treatment plan along with follow-up of her obesity related diagnoses. See Medical Weight Management Flowsheet for complete bioelectrical impedance results.  Condition is not optimized. Biometric data collected today, was reviewed with patient.   Since last office visit patient's  Muscle mass has decreased by 1.6 lb. Fat mass has decreased by 0.4 lb. Total body water has increased by 3.2 lb.  Counseling done  on how various foods will affect these numbers and how to maximize success  Total lbs lost to date: 2  Total weight loss percentage to date: 0.86   Plan: Continue journaling 1200-1300 calories and 90+ grams protein daily   - I showed her the [place] 45 calorie Flour Tortilla, which she can incorporate into her meal plan. I also reviewed with pt how to accurately journal her intake.   Behavioral Intervention Additional resources provided today:  Food journaling log  and Prediabetes Handout Evidence-based interventions for health behavior change were utilized today including the discussion of self monitoring techniques, problem-solving barriers and SMART goal setting techniques.   Regarding patient's less desirable eating habits and patterns, we employed the technique of small changes.  Pt will specifically work on: journaling her intake/bringing food log for next visit.    Recommended Physical Activity Goals  Cattleya has been advised to slowly work up to 150 minutes of moderate intensity aerobic activity a week and strengthening exercises 2-3 times per week for cardiovascular health, weight loss  maintenance and preservation of muscle mass.   She has agreed to Continue current level of physical activity   FOLLOW UP: Return in about 19 days (around 03/28/2023). She was informed of the importance of frequent follow up visits to maximize her success with intensive lifestyle modifications for her multiple health conditions.  Subjective:   Chief complaint: Obesity Marcea is here to discuss her progress with her obesity treatment plan. She is on the the Category 2 Plan and keeping a food journal and adhering to recommended goals of 1200-1300 calories and 80+ protein and states she is following her eating plan approximately 75 % of the time. She states she is walking+ squats 20 minutes 4-5 days per week.  Interval History:  KAMAYAH PILLAY is here today for her first follow-up office visit since starting the program with [place].  Since last OV, Zo endorses that the Category 2 meal plan was not the best for her because she felt it was restrictive and did not like the bread on the plan. She expresses interest in wanting to journal more. Over the last two wks, she endorses that her emotional eating has been pretty well controlled apart from one occasion where she did turn to snacks while stressed.  All blood work/ lab tests that were recently ordered by myself or an outside provider were reviewed with patient today per their request. Extended time was spent counseling her on all new disease processes that were discovered or preexisting ones that are affected by BMI.  she understands that many of these abnormalities will need to monitored regularly along with the current treatment plan of prudent dietary changes, in which we are making each and every office visit, to improve these health parameters.  Review of Systems:  Pertinent positives were addressed with patient today.  Weight Summary and Biometrics   Weight Lost Since Last Visit: 2lb  Weight Gained Since Last Visit: 0lb   Vitals Temp:  98.2 F (36.8 C) BP: 118/83 Pulse Rate: 75 SpO2: 97 %   Anthropometric Measurements Height: 5\' 3"  (1.6 m) Weight: 230 lb (104.3 kg) BMI (Calculated): 40.75 Weight at Last Visit: 230lb Weight Lost Since Last Visit: 2lb Weight Gained Since Last Visit: 0lb Starting Weight: 232lb Total Weight Loss (lbs): 2 lb (0.907 kg) Peak Weight: 240lb   Body Composition  Body Fat %: 48.5 % Fat Mass (lbs): 112 lbs Muscle Mass (lbs): 112.8 lbs Total Body Water (lbs): 88.4  lbs Visceral Fat Rating : 14   Other Clinical Data Fasting: no Labs: no Today's Visit #: 2 Starting Date: 02/23/23   Objective:   PHYSICAL EXAM:  Blood pressure 118/83, pulse 75, temperature 98.2 F (36.8 C), height 5\' 3"  (1.6 m), weight 230 lb (104.3 kg), SpO2 97 %. Body mass index is 40.74 kg/m.  General: Well Developed, well nourished, and in no acute distress.  HEENT: Normocephalic, atraumatic Skin: Warm and dry, cap RF less 2 sec, good turgor Chest:  Normal excursion, shape, no gross abn Respiratory: speaking in full sentences, no conversational dyspnea NeuroM-Sk: Ambulates w/o assistance, moves * 4 Psych: A and O *3, insight good, mood-full  DIAGNOSTIC DATA REVIEWED:  BMET    Component Value Date/Time   NA 141 12/31/2022 1550   NA 138 12/23/2015 1146   K 4.3 12/31/2022 1550   CL 104 12/31/2022 1550   CO2 26 12/31/2022 1550   GLUCOSE 81 12/31/2022 1550   BUN 17 12/31/2022 1550   BUN 14 12/23/2015 1146   CREATININE 1.14 (H) 12/31/2022 1550   CALCIUM 9.5 12/31/2022 1550   GFRNONAA >60 10/19/2020 1140   GFRAA 112 12/23/2015 1146   Lab Results  Component Value Date   HGBA1C 5.7 (H) 12/31/2022   HGBA1C 5.6 12/23/2015   Lab Results  Component Value Date   INSULIN 28.1 (H) 02/23/2023   Lab Results  Component Value Date   TSH 1.57 12/31/2022   CBC    Component Value Date/Time   WBC 8.7 12/31/2022 1550   RBC 4.82 12/31/2022 1550   HGB 13.5 12/31/2022 1550   HCT 40.5 12/31/2022 1550    PLT 401 (H) 12/31/2022 1550   MCV 84.0 12/31/2022 1550   MCH 28.0 12/31/2022 1550   MCHC 33.3 12/31/2022 1550   RDW 13.3 12/31/2022 1550   Iron Studies No results found for: "IRON", "TIBC", "FERRITIN", "IRONPCTSAT" Lipid Panel     Component Value Date/Time   CHOL 168 12/31/2022 1550   CHOL 149 12/23/2015 1146   TRIG 202 (H) 12/31/2022 1550   HDL 43 (L) 12/31/2022 1550   HDL 43 12/23/2015 1146   CHOLHDL 3.9 12/31/2022 1550   LDLCALC 96 12/31/2022 1550   Hepatic Function Panel     Component Value Date/Time   PROT 6.6 12/31/2022 1550   PROT 6.7 12/23/2015 1146   ALBUMIN 3.8 10/19/2020 1140   ALBUMIN 4.1 12/23/2015 1146   AST 16 12/31/2022 1550   ALT 16 12/31/2022 1550   ALKPHOS 59 10/19/2020 1140   BILITOT 0.3 12/31/2022 1550   BILITOT 0.3 12/23/2015 1146      Component Value Date/Time   TSH 1.57 12/31/2022 1550   Nutritional Lab Results  Component Value Date   VD25OH 17.9 (L) 02/23/2023    Attestations:   Reviewed by clinician on day of visit: allergies, medications, problem list, medical history, surgical history, family history, social history, and previous encounter notes.   Patient was in the office today and time spent on visit including pre-visit chart review and post-visit care/coordination of care and electronic medical record documentation was 50 minutes. 50% of the time was in face to face counseling of this patient's medical condition(s) and providing education on treatment options to include the first-line treatment of diet and lifestyle modification.  I, Special Randolm Idol, acting as a Stage manager for [employer], DO., have compiled all relevant documentation for today's office visit on behalf of Thomasene Lot, DO, while in the presence of [employer], DO.  I  have reviewed the above documentation for accuracy and completeness, and I agree with the above. Kirsten Poole, D.O.  The 21st Century Cures Act was signed into law in 2016 which includes  the topic of electronic health records.  This provides immediate access to information in MyChart.  This includes consultation notes, operative notes, office notes, lab results and pathology reports.  If you have any questions about what you read please let us know at your next visit so we can discuss your concerns and take corrective action if need be.  We are right here with you.

## 2023-03-28 ENCOUNTER — Ambulatory Visit (INDEPENDENT_AMBULATORY_CARE_PROVIDER_SITE_OTHER): Payer: BC Managed Care – PPO | Admitting: Family Medicine

## 2023-03-28 ENCOUNTER — Encounter (INDEPENDENT_AMBULATORY_CARE_PROVIDER_SITE_OTHER): Payer: Self-pay | Admitting: Family Medicine

## 2023-03-28 VITALS — BP 132/87 | HR 89 | Temp 98.1°F | Ht 63.0 in | Wt 228.0 lb

## 2023-03-28 DIAGNOSIS — I10 Essential (primary) hypertension: Secondary | ICD-10-CM

## 2023-03-28 DIAGNOSIS — E781 Pure hyperglyceridemia: Secondary | ICD-10-CM

## 2023-03-28 DIAGNOSIS — Z6841 Body Mass Index (BMI) 40.0 and over, adult: Secondary | ICD-10-CM

## 2023-03-28 DIAGNOSIS — R7303 Prediabetes: Secondary | ICD-10-CM

## 2023-03-28 DIAGNOSIS — E559 Vitamin D deficiency, unspecified: Secondary | ICD-10-CM

## 2023-03-28 MED ORDER — VITAMIN D (ERGOCALCIFEROL) 1.25 MG (50000 UNIT) PO CAPS
50000.0000 [IU] | ORAL_CAPSULE | ORAL | 0 refills | Status: DC
Start: 2023-03-28 — End: 2023-04-12

## 2023-03-28 NOTE — Progress Notes (Signed)
Kirsten Poole, D.O.  ABFM, ABOM Specializing in Clinical Bariatric Medicine  Office located at: [address] [place], [place]  [address]     Assessment and Plan:   Medications Discontinued During This Encounter  Medication Reason   Vitamin D, Ergocalciferol, (DRISDOL) 1.25 MG (50000 UNIT) CAPS capsule Reorder    Meds ordered this encounter  Medications   Vitamin D, Ergocalciferol, (DRISDOL) 1.25 MG (50000 UNIT) CAPS capsule    Sig: Take 1 capsule (50,000 Units total) by mouth every 7 (seven) days.    Dispense:  4 capsule    Refill:  0     Vitamin D deficiency - new diagnosis Assessment: Condition is Not optimized. Her vitamin D levels are very low at 17.9. She was placed on Ergocalciferol 50K IU weekly and tolerates that without difficulty.   Lab Results  Component Value Date   VD25OH 17.9 (L) 02/23/2023   Plan: Continue ERGO as instructed. Will refill today. Kirsten Poole is to continue with meal plan and their weight loss efforts to further improve this condition.  Thus, we will need to monitor levels regularly every 3-4 mo on average to keep levels within normal limits and prevent over supplementation.   Prediabetes  Assessment: Condition is Not optimized. Her A1c and insulin are elevated. This is diet/exercise controlled.  Her cravings have decreased significantly but she still has some carb cravings.  Lab Results  Component Value Date   HGBA1C 5.7 (H) 12/31/2022   HGBA1C 5.6 12/23/2015   INSULIN 28.1 (H) 02/23/2023    Plan: Reminded Kirsten Poole that lifestyle changes are the first line treatment option for most all disease processes.  Education provided that "food is medicine" and I encouraged her to be mindful of how certain foods can improve or worsen her medical conditions.  Continue to  decrease simple carbs/ sugars; increase fiber and proteins -> follow her meal plan. Anticipatory guidance given. Kirsten Poole will continue to work on weight loss and follow their  meal plan we devised to help decrease the risk of progressing to diabetes. We will continue to recheck A1c and fasting insulin levels in approximately 3 months from last check, or as deemed appropriate.    Primary hypertension Assessment: Condition is Controlled. She continues hydrochlorothiazide 12.5mg  daily without significant difficulties.  Last 3 blood pressure readings in our office are as follows: BP Readings from Last 3 Encounters:  03/28/23 132/87  03/09/23 118/83  02/25/23 126/82    Plan: Continue hydrochlorothiazide as directed by her PCP. Lifestyle changes such as following our low salt, heart healthy meal plan and engaging in a regular exercise program discussed Ambulatory blood pressure monitoring encouraged. We will continue to monitor closely alongside PCP/ specialists.  Pt reminded to also f/up with those individuals as instructed by them. We will continue to monitor symptoms as they relate to the her weight loss journey.   TREATMENT PLAN FOR OBESITY: BMI 40.0-44.9, adult (HCC)- current BMI 40.9 Morbid obesity with BMI of 40.0-44.9, adult (HCC), Starting BMI 40.8 Assessment:  Kirsten Poole is here to discuss her progress with her obesity treatment plan along with follow-up of her obesity related diagnoses. See Medical Weight Management Flowsheet for complete bioelectrical impedance results.  Condition is docourse: improving but not optimized. Biometric data collected today, was reviewed with patient.   Since last office visit on 03/07/2023 patient's  Muscle mass has decreased by 1lb. Fat mass has decreased by 1.8lb. Total body water has decreased by 3.2lb.  Counseling done  on how various foods will affect these numbers and how to maximize success  Total lbs lost to date: 4lbs Total weight loss percentage to date: 1.72%   Plan: Continue to journal her 1200-1300 calories and 90+ grams protein daily.  - We discussed healthy alternatives of her snacking and lunch options.  Such as laughing cow cheese, low carb tortillas, and cutting out red meat and incorporating lean meats or seafoods. We thoroughly discussed quick, easy, healthy recipes to make at home and replacing certain things with the low sodium option.   Behavioral Intervention Additional resources provided today:  Metformin  Handout Evidence-based interventions for health behavior change were utilized today including the discussion of self monitoring techniques, problem-solving barriers and SMART goal setting techniques.   Regarding patient's less desirable eating habits and patterns, we employed the technique of small changes.  Pt will specifically work on: journaling consistently, bring in food log for next OV for next visit.    Recommended Physical Activity Goals  Kirsten Poole has been advised to slowly work up to 150 minutes of moderate intensity aerobic activity a week and strengthening exercises 2-3 times per week for cardiovascular health, weight loss maintenance and preservation of muscle mass.   She has agreed to Continue current level of physical activity   FOLLOW UP: No follow-ups on file. She was informed of the importance of frequent follow up visits to maximize her success with intensive lifestyle modifications for her multiple health conditions.  Subjective:   Chief complaint: Obesity Kirsten Poole is here to discuss her progress with her obesity treatment plan. She is on the the Category 2 Plan and keeping a food journal and adhering to recommended goals of 1200-1300 calories and 90+ protein and states she is following her eating plan approximately 65% of the time. She states she is walking 10-15 minutes 5 days per week.  Interval History:  Kirsten Poole is here for a follow up office visit. Since last OV she has returned from vacation on the beach and journaled some but not the whole trip. She notes that it is easy to meet her calorie goals but is challenging meeting her protein intake. She has  journaled but does not accurately measure the food intake. She informed me that she prefers to snack on fruit and measures out watermelon. Within journaling for the week she had about 1226 calories with 63g of protein, 1324 calories with 72g of protein, and 1274 calories with 46g of protein.  Review of Systems:  Pertinent positives were addressed with patient today.  Reviewed by clinician on day of visit: allergies, medications, problem list, medical history, surgical history, family history, social history, and previous encounter notes.  Weight Summary and Biometrics   Weight Lost Since Last Visit: 2lb  Weight Gained Since Last Visit: 0lb  Vitals Temp: 98.1 F (36.7 C) BP: 132/87 Pulse Rate: 89 SpO2: 98 %   Anthropometric Measurements Height: 5\' 3"  (1.6 m) Weight: 228 lb (103.4 kg) BMI (Calculated): 40.4 Weight at Last Visit: 230lb Weight Lost Since Last Visit: 2lb Weight Gained Since Last Visit: 0lb Starting Weight: 232lb Total Weight Loss (lbs): 4 lb (1.814 kg) Peak Weight: 240lb   Body Composition  Body Fat %: 48.3 % Fat Mass (lbs): 110.2 lbs Muscle Mass (lbs): 111.8 lbs Total Body Water (lbs): 85.2 lbs Visceral Fat Rating : 14   Other Clinical Data Fasting: no Labs: no Today's Visit #: 3 Starting Date: 02/23/23   Objective:   PHYSICAL EXAM: Blood pressure 132/87, pulse  89, temperature 98.1 F (36.7 C), height 5\' 3"  (1.6 m), weight 228 lb (103.4 kg), SpO2 98%. Body mass index is 40.39 kg/m.  General: Well Developed, well nourished, and in no acute distress.  HEENT: Normocephalic, atraumatic Skin: Warm and dry, cap RF less 2 sec, good turgor Chest:  Normal excursion, shape, no gross abn Respiratory: speaking in full sentences, no conversational dyspnea NeuroM-Sk: Ambulates w/o assistance, moves * 4 Psych: A and O *3, insight good, mood-full  DIAGNOSTIC DATA REVIEWED:  BMET    Component Value Date/Time   NA 141 12/31/2022 1550   NA 138  12/23/2015 1146   K 4.3 12/31/2022 1550   CL 104 12/31/2022 1550   CO2 26 12/31/2022 1550   GLUCOSE 81 12/31/2022 1550   BUN 17 12/31/2022 1550   BUN 14 12/23/2015 1146   CREATININE 1.14 (H) 12/31/2022 1550   CALCIUM 9.5 12/31/2022 1550   GFRNONAA >60 10/19/2020 1140   GFRAA 112 12/23/2015 1146   Lab Results  Component Value Date   HGBA1C 5.7 (H) 12/31/2022   HGBA1C 5.6 12/23/2015   Lab Results  Component Value Date   INSULIN 28.1 (H) 02/23/2023   Lab Results  Component Value Date   TSH 1.57 12/31/2022   CBC    Component Value Date/Time   WBC 8.7 12/31/2022 1550   RBC 4.82 12/31/2022 1550   HGB 13.5 12/31/2022 1550   HCT 40.5 12/31/2022 1550   PLT 401 (H) 12/31/2022 1550   MCV 84.0 12/31/2022 1550   MCH 28.0 12/31/2022 1550   MCHC 33.3 12/31/2022 1550   RDW 13.3 12/31/2022 1550   Iron Studies No results found for: "IRON", "TIBC", "FERRITIN", "IRONPCTSAT" Lipid Panel     Component Value Date/Time   CHOL 168 12/31/2022 1550   CHOL 149 12/23/2015 1146   TRIG 202 (H) 12/31/2022 1550   HDL 43 (L) 12/31/2022 1550   HDL 43 12/23/2015 1146   CHOLHDL 3.9 12/31/2022 1550   LDLCALC 96 12/31/2022 1550   Hepatic Function Panel     Component Value Date/Time   PROT 6.6 12/31/2022 1550   PROT 6.7 12/23/2015 1146   ALBUMIN 3.8 10/19/2020 1140   ALBUMIN 4.1 12/23/2015 1146   AST 16 12/31/2022 1550   ALT 16 12/31/2022 1550   ALKPHOS 59 10/19/2020 1140   BILITOT 0.3 12/31/2022 1550   BILITOT 0.3 12/23/2015 1146      Component Value Date/Time   TSH 1.57 12/31/2022 1550   Nutritional Lab Results  Component Value Date   VD25OH 17.9 (L) 02/23/2023    Attestations:   This encounter took 40 total minutes of time including any pre-visit and post-visit time spent on this date of service, including taking a thorough history, reviewing any labs and/or imaging, reviewing prior notes, as well as documenting in the electronic health record on the date of service. Over 50%  of that time was in direct face-to-face counseling and coordinating care for the patient today  I, Clinical biochemist, acting as a Stage manager for [employer], DO., have compiled all relevant documentation for today's office visit on behalf of Thomasene Lot, DO, while in the presence of [employer], DO.  I have reviewed the above documentation for accuracy and completeness, and I agree with the above. Kirsten Poole, D.O.  The 21st Century Cures Act was signed into law in 2016 which includes the topic of electronic health records.  This provides immediate access to information in MyChart.  This includes consultation notes, operative  notes, office notes, lab results and pathology reports.  If you have any questions about what you read please let us know at your next visit so we can discuss your concerns and take corrective action if need be.  We are right here with you.

## 2023-04-07 ENCOUNTER — Telehealth: Payer: Self-pay | Admitting: Nurse Practitioner

## 2023-04-07 NOTE — Telephone Encounter (Signed)
Copied from CRM [id number]. Topic: General - Other >> Apr 07, 2023  8:37 AM Lennox Pippins wrote: Patient has accepted a new job and needs medical exam form filled out and tb test and needs this prior to August 9th. Last CPE on 6.14.2024  Patients callback #: [phone]

## 2023-04-07 NOTE — Telephone Encounter (Signed)
Left message for patient to drop off paperwork tomorrow and we can order tb test

## 2023-04-08 ENCOUNTER — Telehealth: Payer: Self-pay | Admitting: Nurse Practitioner

## 2023-04-08 DIAGNOSIS — Z111 Encounter for screening for respiratory tuberculosis: Secondary | ICD-10-CM

## 2023-04-08 NOTE — Telephone Encounter (Signed)
Patient stopped in and said that she was told to come by with her form for work and that we could do a tb test for her job but there was not a order. Could you please place an order in so the patient can come get the test done and she is leaving her paper to be filled out in the dr folder. Patient number [phone].

## 2023-04-12 ENCOUNTER — Encounter (INDEPENDENT_AMBULATORY_CARE_PROVIDER_SITE_OTHER): Payer: Self-pay | Admitting: Family Medicine

## 2023-04-12 ENCOUNTER — Ambulatory Visit (INDEPENDENT_AMBULATORY_CARE_PROVIDER_SITE_OTHER): Payer: BC Managed Care – PPO | Admitting: Family Medicine

## 2023-04-12 VITALS — BP 134/81 | HR 98 | Temp 98.9°F | Ht 63.0 in | Wt 228.0 lb

## 2023-04-12 DIAGNOSIS — Z6841 Body Mass Index (BMI) 40.0 and over, adult: Secondary | ICD-10-CM | POA: Diagnosis not present

## 2023-04-12 DIAGNOSIS — R7303 Prediabetes: Secondary | ICD-10-CM | POA: Diagnosis not present

## 2023-04-12 DIAGNOSIS — E559 Vitamin D deficiency, unspecified: Secondary | ICD-10-CM

## 2023-04-12 MED ORDER — VITAMIN D (ERGOCALCIFEROL) 1.25 MG (50000 UNIT) PO CAPS
50000.0000 [IU] | ORAL_CAPSULE | ORAL | 0 refills | Status: DC
Start: 2023-04-12 — End: 2023-04-12

## 2023-04-12 MED ORDER — VITAMIN D (ERGOCALCIFEROL) 1.25 MG (50000 UNIT) PO CAPS
50000.0000 [IU] | ORAL_CAPSULE | ORAL | 0 refills | Status: AC
Start: 2023-04-12 — End: ?

## 2023-04-12 NOTE — Progress Notes (Signed)
[name], D.O.  ABFM, ABOM Specializing in Clinical Bariatric Medicine  Office located at: [address] [place], [place]  [address]     Assessment and Plan:   Medications Discontinued During This Encounter  Medication Reason   Vitamin D, Ergocalciferol, (DRISDOL) 1.25 MG (50000 UNIT) CAPS capsule Reorder   Vitamin D, Ergocalciferol, (DRISDOL) 1.25 MG (50000 UNIT) CAPS capsule      Meds ordered this encounter  Medications   DISCONTD: Vitamin D, Ergocalciferol, (DRISDOL) 1.25 MG (50000 UNIT) CAPS capsule    Sig: Take 1 capsule (50,000 Units total) by mouth every 7 (seven) days.    Dispense:  4 capsule    Refill:  0   Vitamin D, Ergocalciferol, (DRISDOL) 1.25 MG (50000 UNIT) CAPS capsule    Sig: Take 1 capsule (50,000 Units total) by mouth every 7 (seven) days.    Dispense:  8 capsule    Refill:  0    60 days     Prediabetes Assessment & Plan: Condition is currently diet/exercise controlled. Pt endorses that when following her meal plan, her hunger and cravings are better controlled. No concerns in this regard today.   Lab Results  Component Value Date   HGBA1C 5.7 (H) 12/31/2022   HGBA1C 5.6 12/23/2015   INSULIN 28.1 (H) 02/23/2023    Reminded patient that having protein with each meal is an important part of her pre-diabetes management and for controlling hunger and cravings, and increasing metabolism. Laesha will continue to work on weight loss, exercise, via their meal plan we devised to help decrease the risk of progressing to diabetes. We will recheck A1c and fasting insulin level as deemed appropriate.    Vitamin D deficiency Assessment & Plan: Condition is being treated with ERGO 50K lU once weekly; she is responding to supplement well and denies any side effects.   Lab Results  Component Value Date   VD25OH 17.9 (L) 02/23/2023   Continue with meal plan, their weight loss efforts, and ERGO at current dose to further improve this condition. Will  refill ERGO today.  Will recheck levels in roughly 2 months.    TREATMENT PLAN FOR OBESITY: BMI 40.0-44.9, adult (HCC)- current BMI 40.4 Morbid obesity with BMI of 40.0-44.9, adult (HCC), Starting BMI 40.8 Assessment & Plan:  AYMAN FONSECA is here to discuss her progress with her obesity treatment plan along with follow-up of her obesity related diagnoses. See Medical Weight Management Flowsheet for complete bioelectrical impedance results.  Condition is improving. Biometric data collected today, was reviewed with patient.   Since last office visit on 7/15/2 patient's  Muscle mass has increased by 9.6 lb. Fat mass has decreased by 9.4 lb. Total body water has increased by 0.6 lb.  Counseling done on how various foods will affect these numbers and how to maximize success  Total lbs lost to date: 4 lbs Total weight loss percentage to date: 1.72%    I encouraged pt to look at the nutritional menu before eating out and aiming to eat foods with a 10:1 ratio of calories to protein. Because she cannot see [place] for 2 months, I highly encouraged her to journal very accurately. Pt agrees to continue to journal 1200-1300 calories and 90+ grams protein daily.   Behavioral Intervention Additional resources provided today:  Food journaling log handout Evidence-based interventions for health behavior change were utilized today including the discussion of self monitoring techniques, problem-solving barriers and SMART goal setting techniques.   Regarding patient's less  desirable eating habits and patterns, we employed the technique of small changes.  Pt will specifically work on: continue journaling/bringing in food log and increasing lean protein intake for next visit.    Recommended Physical Activity Goals  Nariah has been advised to slowly work up to 150 minutes of moderate intensity aerobic activity a week and strengthening exercises 2-3 times per week for cardiovascular health, weight loss maintenance  and preservation of muscle mass. She has agreed to Continue current level of physical activity   FOLLOW UP: Return in about 2 months. She was informed of the importance of frequent follow up visits to maximize her success with intensive lifestyle modifications for her multiple health conditions.  Subjective:   Chief complaint: Obesity Farhiya is here to discuss her progress with her obesity treatment plan. She is on keeping a food journal and adhering to recommended goals of 1200-1300 calories and 90+ protein and states she is following her eating plan approximately 40% of the time. She states she is not exercising.   Interval History:  TENIKA ARCARI is here for a follow up office visit. Since last OV, Gwendolin reports traveling to [place] and walking more while there. Endorses staying in a hotel and eating off plan foods/eating out. However, when she was at home, she endorses concentrating on her protein intake and doubling it to roughly 80 grams daily. Pt changed her job and due to changes in insurance, she cannot return until 2 months.   Review of Systems:  Pertinent positives were addressed with patient today.  Reviewed by clinician on day of visit: allergies, medications, problem list, medical history, surgical history, family history, social history, and previous encounter notes.  Weight Summary and Biometrics   Weight Lost Since Last Visit: 0lb  Weight Gained Since Last Visit: 0lb    Vitals Temp: 98.9 F (37.2 C) BP: 134/81 Pulse Rate: 98 SpO2: 97 %   Anthropometric Measurements Height: 5\' 3"  (1.6 m) Weight: 228 lb (103.4 kg) BMI (Calculated): 40.4 Weight at Last Visit: 228lb Weight Lost Since Last Visit: 0lb Weight Gained Since Last Visit: 0lb Starting Weight: 232lb Total Weight Loss (lbs): 4 lb (1.814 kg) Peak Weight: 240lb   Body Composition  Body Fat %: 44.1 % Fat Mass (lbs): 100.8 lbs Muscle Mass (lbs): 121.4 lbs Total Body Water (lbs): 85.8 lbs Visceral  Fat Rating : 13   Other Clinical Data Fasting: no Labs: no Today's Visit #: 4 Starting Date: 02/23/23   Objective:   PHYSICAL EXAM: Blood pressure 134/81, pulse 98, temperature 98.9 F (37.2 C), height 5\' 3"  (1.6 m), weight 228 lb (103.4 kg), SpO2 97%. Body mass index is 40.39 kg/m.  General: Well Developed, well nourished, and in no acute distress.  HEENT: Normocephalic, atraumatic Skin: Warm and dry, cap RF less 2 sec, good turgor Chest:  Normal excursion, shape, no gross abn Respiratory: speaking in full sentences, no conversational dyspnea NeuroM-Sk: Ambulates w/o assistance, moves * 4 Psych: A and O *3, insight good, mood-full  DIAGNOSTIC DATA REVIEWED:  BMET    Component Value Date/Time   NA 141 12/31/2022 1550   NA 138 12/23/2015 1146   K 4.3 12/31/2022 1550   CL 104 12/31/2022 1550   CO2 26 12/31/2022 1550   GLUCOSE 81 12/31/2022 1550   BUN 17 12/31/2022 1550   BUN 14 12/23/2015 1146   CREATININE 1.14 (H) 12/31/2022 1550   CALCIUM 9.5 12/31/2022 1550   GFRNONAA >60 10/19/2020 1140   GFRAA 112 12/23/2015 1146  Lab Results  Component Value Date   HGBA1C 5.7 (H) 12/31/2022   HGBA1C 5.6 12/23/2015   Lab Results  Component Value Date   INSULIN 28.1 (H) 02/23/2023   Lab Results  Component Value Date   TSH 1.57 12/31/2022   CBC    Component Value Date/Time   WBC 8.7 12/31/2022 1550   RBC 4.82 12/31/2022 1550   HGB 13.5 12/31/2022 1550   HCT 40.5 12/31/2022 1550   PLT 401 (H) 12/31/2022 1550   MCV 84.0 12/31/2022 1550   MCH 28.0 12/31/2022 1550   MCHC 33.3 12/31/2022 1550   RDW 13.3 12/31/2022 1550   Iron Studies No results found for: "IRON", "TIBC", "FERRITIN", "IRONPCTSAT" Lipid Panel     Component Value Date/Time   CHOL 168 12/31/2022 1550   CHOL 149 12/23/2015 1146   TRIG 202 (H) 12/31/2022 1550   HDL 43 (L) 12/31/2022 1550   HDL 43 12/23/2015 1146   CHOLHDL 3.9 12/31/2022 1550   LDLCALC 96 12/31/2022 1550   Hepatic Function  Panel     Component Value Date/Time   PROT 6.6 12/31/2022 1550   PROT 6.7 12/23/2015 1146   ALBUMIN 3.8 10/19/2020 1140   ALBUMIN 4.1 12/23/2015 1146   AST 16 12/31/2022 1550   ALT 16 12/31/2022 1550   ALKPHOS 59 10/19/2020 1140   BILITOT 0.3 12/31/2022 1550   BILITOT 0.3 12/23/2015 1146      Component Value Date/Time   TSH 1.57 12/31/2022 1550   Nutritional Lab Results  Component Value Date   VD25OH 17.9 (L) 02/23/2023    Attestations:   I, Special Puri , acting as a Stage manager for Thomasene Lot, DO., have compiled all relevant documentation for today's office visit on behalf of Thomasene Lot, DO, while in the presence of [employer], DO.  I have reviewed the above documentation for accuracy and completeness, and I agree with the above. [name], D.O.  The 21st Century Cures Act was signed into law in 2016 which includes the topic of electronic health records.  This provides immediate access to information in MyChart.  This includes consultation notes, operative notes, office notes, lab results and pathology reports.  If you have any questions about what you read please let us know at your next visit so we can discuss your concerns and take corrective action if need be.  We are right here with you.

## 2023-05-05 ENCOUNTER — Telehealth: Payer: Self-pay

## 2023-05-05 NOTE — Transitions of Care (Post Inpatient/ED Visit) (Signed)
   05/05/2023  Name: Kirsten Poole MRN: [id number] DOB: [birth year]  Today's TOC FU Call Status: Today's TOC FU Call Status:: Successful TOC FU Call Completed TOC FU Call Complete Date: 05/05/23  Transition Care Management Follow-up Telephone Call Date of Discharge: 05/04/23 Discharge Facility: Other ([facility]) Name of Other ([facility]) Discharge Facility: [facility] Type of Discharge: Inpatient Admission How have you been since you were released from the hospital?: Better Any questions or concerns?: No  Items Reviewed: Did you receive and understand the discharge instructions provided?: Yes Medications obtained,verified, and reconciled?: Yes (Medications Reviewed) Any new allergies since your discharge?: No Dietary orders reviewed?: Yes Do you have support at home?: Yes People in Home: spouse  Medications Reviewed Today: Medications Reviewed Today     Reviewed by Karena Addison, LPN (Licensed Practical Nurse) on 05/05/23 at [phone]  Med List Status: <None>   Medication Order Taking? Sig Documenting Provider Last Dose Status Informant  amLODipine (NORVASC) 5 MG tablet [id number] Yes Take 1 tablet by mouth daily. [provider] Taking Active   hydrochlorothiazide (HYDRODIURIL) 12.5 MG tablet [id number] No Take 1 tablet (12.5 mg total) by mouth daily.  Patient not taking: Reported on 05/05/2023   [name], FNP Not Taking Active   levonorgestrel (MIRENA) 20 MCG/DAY IUD [id number] Yes 1 each by Intrauterine route once. Mirna Mires, CNM Taking Active   Vitamin D, Ergocalciferol, (DRISDOL) 1.25 MG (50000 UNIT) CAPS capsule [id number] Yes Take 1 capsule (50,000 Units total) by mouth every 7 (seven) days. Thomasene Lot, DO Taking Active             Home Care and Equipment/Supplies: Were Home Health Services Ordered?: NA Any new equipment or medical supplies ordered?: NA  Functional Questionnaire: Do you need assistance with bathing/showering or  dressing?: No Do you need assistance with meal preparation?: No Do you need assistance with eating?: No Do you have difficulty maintaining continence: No Do you need assistance with getting out of bed/getting out of a chair/moving?: No  Follow up appointments reviewed: PCP Follow-up appointment confirmed?: Yes Date of PCP follow-up appointment?: 05/11/23 Follow-up Provider: [facility] Follow-up appointment confirmed?: No Reason Specialist Follow-Up Not Confirmed: Patient has Specialist Provider Number and will Call for Appointment Do you need transportation to your follow-up appointment?: No Do you understand care options if your condition(s) worsen?: Yes-patient verbalized understanding    SIGNATURE Karena Addison, LPN [facility] Nurse Health Advisor Direct Dial [phone]

## 2023-05-11 ENCOUNTER — Encounter: Payer: Self-pay | Admitting: Nurse Practitioner

## 2023-05-11 ENCOUNTER — Other Ambulatory Visit: Payer: Self-pay

## 2023-05-11 ENCOUNTER — Ambulatory Visit (INDEPENDENT_AMBULATORY_CARE_PROVIDER_SITE_OTHER): Payer: BC Managed Care – PPO | Admitting: Nurse Practitioner

## 2023-05-11 VITALS — BP 122/76 | HR 78 | Temp 97.9°F | Resp 16 | Ht 63.0 in | Wt 232.0 lb

## 2023-05-11 DIAGNOSIS — Z09 Encounter for follow-up examination after completed treatment for conditions other than malignant neoplasm: Secondary | ICD-10-CM | POA: Diagnosis not present

## 2023-05-11 DIAGNOSIS — I10 Essential (primary) hypertension: Secondary | ICD-10-CM | POA: Diagnosis not present

## 2023-05-11 DIAGNOSIS — R1013 Epigastric pain: Secondary | ICD-10-CM

## 2023-05-11 NOTE — Assessment & Plan Note (Signed)
Patient not on medication and doing well.

## 2023-05-11 NOTE — Progress Notes (Signed)
BP 122/76   Pulse 78   Temp 97.9 F (36.6 C) (Oral)   Resp 16   Ht 5\' 3"  (1.6 m)   Wt 232 lb (105.2 kg)   SpO2 97%   BMI 41.10 kg/m    Subjective:    Patient ID: Kirsten Poole, female    DOB: 26-Jan-1976, 47 y.o.   MRN: [id number]  HPI: ZURISADAY BULT is a 47 y.o. female  Chief Complaint  Patient presents with   Hospitalization Follow-up   Hospital follow up/ epigastric pain:admitted for epigastric pain at [facility] Admitted on : 05/01/2023 Discharged on: 05/04/2023 Medication changes: stopped hydrochlorothiazide and started on amlodipine 5 mg daily, patient did not start the amlodipine.  Medication reconciliation completed Note:Acute Severe Abdominal Pain Nausea, Vomiting Patient presented with 2 day history of severe, constant abdominal pain isolated to the epigastric/left upper quadrant. Onset slowly over 12 hours, worsening to nausea and profuse vomiting after a large, fatty meal. No blood in vomit or stools. CT A/P c/w inflammation around pancreas body and tail, hiatal hernia, and nodules around the distal esophagus of unclear etiology. C/f pancreatitis with clinical symptoms, leukocytosis, although notably did not have elevation of lipase. Patient was treated with supportive management including IVF, IV NV and pain medications. RUQ US done during admission and was normal. EGD done during hospitalization and showed normal esophagus, gastritis and h pylori testing and biopsy done. It is possible that gastritis resulted in nearby inflammation of pancreas, as lipase remained WNL. CT scan planned in 4-6 weeks to reevaluate esophageal nodes. Would recommend follow-up with GI outpatient in advanced clinic, following H pylori biopsy. Biopsy came back positive for H.pylori treatment sent in by GI. Omeprazole and pylera. She has not started them yet but she is going to get it today. Patient reports she is feeling better.   Ct abd/pelivs:Moderate inflammatory stranding surrounding the  pancreatic body and tail. Concerning for acute interstitial pancreatitis. Recommend clinical correlation with lab values. Follow-up is recommended to ensure resolution of this finding  -- Small hiatal hernia. There are nodular areas adjacent to the distal esophagus measuring up to 2.0 x 1.4 and 1.3 x 1.0 cm.. These lesions could be communicating with each other and therefore may also represent an exophytic stromal mass although the differential diagnosis still includes prominent paraesophageal lymph nodes. Further assessment with direct visualization and EUS is recommended.   RUQ ultrasound: No gallstones or biliary dilatation.   Upper endoscopy: The esophagus was normal.       A single localized small erosion with no bleeding and no stigmata of       recent bleeding was found in the prepyloric region of the stomach.       Diffuse mild inflammation characterized by erythema was found in the       gastric body. Biopsies were taken with a cold forceps for Helicobacter       pylori testing.       The cardia and gastric fundus were normal on retroflexion.       The examined duodenum was normal   Discharged plan: Outpatient to do:  [ ]  CT scan planned in 4-6 weeks to reevaluate esophageal nodes, scheduled [ ]  Follow-up with GI outpatient in advanced clinic, following H pylori biopsy, Appointment 06/13/2023 [ ]  Consider further genetic workup for pancreatitis given etiology unclear during hospitalization [ ]  Follow BP and titrate amlodipine as needed, not taking amlodipine, blood pressure has been fine without medication  Hypertension:  -Medications:  changed from hydrochlorothiazide to amlodipine 5 mg daily -Patient is compliant with above medications and reports no side effects. -Checking BP at home (average): she reports her blood pressure has been fine without medication. She says she got a new job and is less stressed.  -Denies any SOB, CP, vision changes, LE edema or symptoms of  hypotension     05/11/2023    1:24 PM 04/12/2023    4:00 PM 03/28/2023    2:00 PM  Vitals with BMI  Height 5\' 3"  5\' 3"  5\' 3"   Weight 232 lbs 228 lbs 228 lbs  BMI 41.11 40.4 40.4  Systolic 122 134 191  Diastolic 76 81 87  Pulse 78 98 89    Relevant past medical, surgical, family and social history reviewed and updated as indicated. Interim medical history since our last visit reviewed. Allergies and medications reviewed and updated.  Review of Systems  Constitutional: Negative for fever or weight change.  Respiratory: Negative for cough and shortness of breath.   Cardiovascular: Negative for chest pain or palpitations.  Gastrointestinal: Negative for abdominal pain, no bowel changes.  Musculoskeletal: Negative for gait problem or joint swelling.  Skin: Negative for rash.  Neurological: Negative for dizziness or headache.  No other specific complaints in a complete review of systems (except as listed in HPI above).      Objective:    BP 122/76   Pulse 78   Temp 97.9 F (36.6 C) (Oral)   Resp 16   Ht 5\' 3"  (1.6 m)   Wt 232 lb (105.2 kg)   SpO2 97%   BMI 41.10 kg/m   Wt Readings from Last 3 Encounters:  05/11/23 232 lb (105.2 kg)  04/12/23 228 lb (103.4 kg)  03/28/23 228 lb (103.4 kg)    Physical Exam  Constitutional: Patient appears well-developed and well-nourished. Obese  No distress.  HEENT: head atraumatic, normocephalic, pupils equal and reactive to light, neck supple, throat within normal limits Cardiovascular: Normal rate, regular rhythm and normal heart sounds.  No murmur heard. No BLE edema. Pulmonary/Chest: Effort normal and breath sounds normal. No respiratory distress. Abdominal: Soft.  There is no tenderness. Psychiatric: Patient has a normal mood and affect. behavior is normal. Judgment and thought content normal.      Assessment & Plan:   Problem List Items Addressed This Visit       Cardiovascular and Mediastinum   Primary hypertension     Patient not on medication and doing well.       Other Visit Diagnoses     Hospital discharge follow-up    -  Primary   pick up treatment for H. pylori, keep follow up appointment with GI, and ct scan scheduled.   Epigastric pain       pick up treatment for H. pylori, keep follow up appointment with GI, and ct scan scheduled.        Follow up plan: Return if symptoms worsen or fail to improve.

## 2023-06-13 ENCOUNTER — Ambulatory Visit (INDEPENDENT_AMBULATORY_CARE_PROVIDER_SITE_OTHER): Payer: BC Managed Care – PPO | Admitting: Family Medicine

## 2023-06-13 ENCOUNTER — Encounter (INDEPENDENT_AMBULATORY_CARE_PROVIDER_SITE_OTHER): Payer: Self-pay | Admitting: Family Medicine

## 2023-06-13 VITALS — BP 136/81 | HR 70 | Temp 99.0°F | Ht 63.0 in | Wt 228.0 lb

## 2023-06-13 DIAGNOSIS — I10 Essential (primary) hypertension: Secondary | ICD-10-CM

## 2023-06-13 DIAGNOSIS — R7303 Prediabetes: Secondary | ICD-10-CM

## 2023-06-13 DIAGNOSIS — Z6841 Body Mass Index (BMI) 40.0 and over, adult: Secondary | ICD-10-CM

## 2023-06-13 DIAGNOSIS — E559 Vitamin D deficiency, unspecified: Secondary | ICD-10-CM | POA: Diagnosis not present

## 2023-06-13 MED ORDER — VITAMIN D (ERGOCALCIFEROL) 1.25 MG (50000 UNIT) PO CAPS
50000.0000 [IU] | ORAL_CAPSULE | ORAL | 0 refills | Status: DC
Start: 2023-06-13 — End: 2023-07-11

## 2023-06-13 NOTE — Progress Notes (Signed)
[name], D.O.  ABFM, ABOM Specializing in Clinical Bariatric Medicine  Office located at: [address] [place], [place]  [address]     Assessment and Plan:   Medications Discontinued During This Encounter  Medication Reason   Vitamin D, Ergocalciferol, (DRISDOL) 1.25 MG (50000 UNIT) CAPS capsule Reorder   Meds ordered this encounter  Medications   Vitamin D, Ergocalciferol, (DRISDOL) 1.25 MG (50000 UNIT) CAPS capsule    Sig: Take 1 capsule (50,000 Units total) by mouth every 7 (seven) days.    Dispense:  4 capsule    Refill:  0    60 days    Primary hypertension- diet controlled currently. Assessment: Condition is Controlled.. This is now diet/exercise controlled. She was discontinued on Hydrochlorothiazide as she developed pancreatitis on this. She notices since stopping this her ankles have began to swell. She monitors her BP at home and it averages At home her BP averages 117-130's.  Last 3 blood pressure readings in our office are as follows: BP Readings from Last 3 Encounters:  06/13/23 136/81  05/11/23 122/76  04/12/23 134/81    Plan: - [name] B Setter BP is 136/81 at goal today.   - Lifestyle changes such as following our low salt, heart healthy meal plan and engaging in a regular exercise program discussed   - Blood pressure monitoring encouraged.   - We will continue to monitor closely alongside PCP/ specialists.  Pt reminded to also f/up with those individuals as instructed by them.    Vitamin D deficiency Assessment: Condition is Not at goal.. Pt endorses taking ERGO 50k once weekly and has been tolerating this well. She denies any adverse side effects on this medication. Lab Results  Component Value Date   VD25OH 17.9 (L) 02/23/2023   Plan: - Continue with Ergocalciferol 50K IU as directed. I will refill today.  - weight loss will likely improve availability of vitamin D, thus encouraged Smt. to continue with meal plan and their weight  loss efforts to further improve this condition.  Thus, we will need to monitor levels regularly (every 3-4 mo on average) to keep levels within normal limits and prevent over supplementation.   Prediabetes Assessment: Condition is Not at goal.. This is diet/exercise controlled. Pt endorses her hunger and cravings are well controlled when focused and following the meal plan. Lab Results  Component Value Date   HGBA1C 5.7 (H) 12/31/2022   HGBA1C 5.6 12/23/2015   INSULIN 28.1 (H) 02/23/2023    Plan: -  Continue her prudent nutritional plan that is low in simple carbohydrates, saturated fats and trans fats to goal of 5-10% weight loss to achieve significant health benefits.  Pt encouraged to continually advance exercise and cardiovascular fitness as tolerated throughout weight loss journey.  - Drink at least half of your weight in ounces of water per day unless otherwise noted by one of your doctors that you must restrict water intake.    - Continue to decrease simple carbs/ sugars; increase fiber and proteins -> follow her meal plan.    - Anticipatory guidance given.    - We will recheck A1c and fasting insulin level in approximately 3 months from last check, or as deemed appropriate.    TREATMENT PLAN FOR OBESITY: BMI 40.0-44.9, adult (HCC)- current BMI 40.4 Morbid obesity with BMI of 40.0-44.9, adult (HCC), Starting BMI 40.8 Assessment:  [name] is here to discuss her progress with her obesity treatment plan along with follow-up of her obesity  related diagnoses. See Medical Weight Management Flowsheet for complete bioelectrical impedance results.  Condition is not optimized. Biometric data collected today, was reviewed with patient.   Since last office visit on 04/12/2023 patient's  Muscle mass has decreased by 11.6lb. Fat mass has increased by 12.4lb. Total body water has decreased by 5.4lb.  Counseling done on how various foods will affect these numbers and how to maximize  success  Total lbs lost to date: 4 Total weight loss percentage to date: 1.72%   Plan: - Lawan will work on healthier eating habits and journaling and adhering to recommended goals of 1200-1300 calories and 90+ protein   - I advised pt to watch out for fatty cheeses as she was eating them as snacks.   Behavioral Intervention Additional resources provided today: Recipes and food journal log Evidence-based interventions for health behavior change were utilized today including the discussion of self monitoring techniques, problem-solving barriers and SMART goal setting techniques.   Regarding patient's less desirable eating habits and patterns, we employed the technique of small changes.  Pt will specifically work on: focus on consistently journaling for next visit.     She has agreed to Think about ways to increase daily physical activity and overcoming barriers to exercise   FOLLOW UP: Return in about 4 weeks (around 07/11/2023).  She was informed of the importance of frequent follow up visits to maximize her success with intensive lifestyle modifications for her multiple health conditions.  Subjective:   Chief complaint: Obesity Itzamar is here to discuss her progress with her obesity treatment plan. She is on the keeping a food journal and adhering to recommended goals of 1200-1300 calories and 90+ protein and states she is following her eating plan approximately 50% of the time. She states she is not exercising.  Interval History:  SANAI FRICK is here for a follow up office visit.     Since last office visit:  She has been ok more or less. She informed me that she was admitted into the hospital on 05/01/2023 for pancreatitis due to hydrochlorothiazide and was discontinued on this. She noted going through a moment when she wasn't eating but has her appetite back. She has tried to restart journaling again but has been inconsistent. She has been trying to eat healthy snacks but does  indulge in cottage cheese and cheese. Pt informed me that she had increased her diet soda intake and decreased her water intake.    We reviewed her meal plan and all questions were answered.   Review of Systems:  Pertinent positives were addressed with patient today.  Reviewed by clinician on day of visit: allergies, medications, problem list, medical history, surgical history, family history, social history, and previous encounter notes.  Weight Summary and Biometrics   Weight Lost Since Last Visit: 0lb  Weight Gained Since Last Visit: 0lb   Vitals Temp: 99 F (37.2 C) BP: 136/81 Pulse Rate: 70 SpO2: 100 %   Anthropometric Measurements Height: 5\' 3"  (1.6 m) Weight: 228 lb (103.4 kg) BMI (Calculated): 40.4 Weight at Last Visit: 228lb Weight Lost Since Last Visit: 0lb Weight Gained Since Last Visit: 0lb Starting Weight: 232lb Total Weight Loss (lbs): 4 lb (1.814 kg) Peak Weight: 240lb   Body Composition  Body Fat %: 49.5 % Fat Mass (lbs): 113.2 lbs Muscle Mass (lbs): 109.8 lbs Total Body Water (lbs): 91.2 lbs Visceral Fat Rating : 14   Other Clinical Data Fasting: no Labs: no Today's Visit #: 5 Starting Date:  02/23/23    Objective:   PHYSICAL EXAM: Blood pressure 136/81, pulse 70, temperature 99 F (37.2 C), height 5\' 3"  (1.6 m), weight 228 lb (103.4 kg), SpO2 100%. Body mass index is 40.39 kg/m.  General: Well Developed, well nourished, and in no acute distress.  HEENT: Normocephalic, atraumatic Skin: Warm and dry, cap RF less 2 sec, good turgor Chest:  Normal excursion, shape, no gross abn Respiratory: speaking in full sentences, no conversational dyspnea NeuroM-Sk: Ambulates w/o assistance, moves * 4 Psych: A and O *3, insight good, mood-full  DIAGNOSTIC DATA REVIEWED:  BMET    Component Value Date/Time   NA 141 12/31/2022 1550   NA 138 12/23/2015 1146   K 4.3 12/31/2022 1550   CL 104 12/31/2022 1550   CO2 26 12/31/2022 1550   GLUCOSE 81  12/31/2022 1550   BUN 17 12/31/2022 1550   BUN 14 12/23/2015 1146   CREATININE 1.14 (H) 12/31/2022 1550   CALCIUM 9.5 12/31/2022 1550   GFRNONAA >60 10/19/2020 1140   GFRAA 112 12/23/2015 1146   Lab Results  Component Value Date   HGBA1C 5.7 (H) 12/31/2022   HGBA1C 5.6 12/23/2015   Lab Results  Component Value Date   INSULIN 28.1 (H) 02/23/2023   Lab Results  Component Value Date   TSH 1.57 12/31/2022   CBC    Component Value Date/Time   WBC 8.7 12/31/2022 1550   RBC 4.82 12/31/2022 1550   HGB 13.5 12/31/2022 1550   HCT 40.5 12/31/2022 1550   PLT 401 (H) 12/31/2022 1550   MCV 84.0 12/31/2022 1550   MCH 28.0 12/31/2022 1550   MCHC 33.3 12/31/2022 1550   RDW 13.3 12/31/2022 1550   Iron Studies No results found for: "IRON", "TIBC", "FERRITIN", "IRONPCTSAT" Lipid Panel     Component Value Date/Time   CHOL 168 12/31/2022 1550   CHOL 149 12/23/2015 1146   TRIG 202 (H) 12/31/2022 1550   HDL 43 (L) 12/31/2022 1550   HDL 43 12/23/2015 1146   CHOLHDL 3.9 12/31/2022 1550   LDLCALC 96 12/31/2022 1550   Hepatic Function Panel     Component Value Date/Time   PROT 6.6 12/31/2022 1550   PROT 6.7 12/23/2015 1146   ALBUMIN 3.8 10/19/2020 1140   ALBUMIN 4.1 12/23/2015 1146   AST 16 12/31/2022 1550   ALT 16 12/31/2022 1550   ALKPHOS 59 10/19/2020 1140   BILITOT 0.3 12/31/2022 1550   BILITOT 0.3 12/23/2015 1146      Component Value Date/Time   TSH 1.57 12/31/2022 1550   Nutritional Lab Results  Component Value Date   VD25OH 17.9 (L) 02/23/2023    Attestations:   I, Clinical biochemist, acting as a Stage manager for [employer], DO., have compiled all relevant documentation for today's office visit on behalf of Thomasene Lot, DO, while in the presence of [employer], DO.  I have reviewed the above documentation for accuracy and completeness, and I agree with the above. [name], D.O.  The 21st Century Cures Act was signed into law in 2016 which  includes the topic of electronic health records.  This provides immediate access to information in MyChart.  This includes consultation notes, operative notes, office notes, lab results and pathology reports.  If you have any questions about what you read please let us know at your next visit so we can discuss your concerns and take corrective action if need be.  We are right here with you.

## 2023-07-11 ENCOUNTER — Other Ambulatory Visit (INDEPENDENT_AMBULATORY_CARE_PROVIDER_SITE_OTHER): Payer: Self-pay | Admitting: Family Medicine

## 2023-07-11 ENCOUNTER — Encounter (INDEPENDENT_AMBULATORY_CARE_PROVIDER_SITE_OTHER): Payer: Self-pay | Admitting: Family Medicine

## 2023-07-11 ENCOUNTER — Ambulatory Visit (INDEPENDENT_AMBULATORY_CARE_PROVIDER_SITE_OTHER): Payer: BC Managed Care – PPO | Admitting: Family Medicine

## 2023-07-11 VITALS — BP 132/84 | HR 68 | Temp 97.9°F | Ht 63.0 in | Wt 229.0 lb

## 2023-07-11 DIAGNOSIS — R7303 Prediabetes: Secondary | ICD-10-CM

## 2023-07-11 DIAGNOSIS — Z6841 Body Mass Index (BMI) 40.0 and over, adult: Secondary | ICD-10-CM

## 2023-07-11 DIAGNOSIS — E559 Vitamin D deficiency, unspecified: Secondary | ICD-10-CM

## 2023-07-11 DIAGNOSIS — F5089 Other specified eating disorder: Secondary | ICD-10-CM

## 2023-07-11 MED ORDER — VITAMIN D (ERGOCALCIFEROL) 1.25 MG (50000 UNIT) PO CAPS
50000.0000 [IU] | ORAL_CAPSULE | ORAL | 0 refills | Status: DC
Start: 2023-07-11 — End: 2023-08-16

## 2023-07-11 NOTE — Progress Notes (Signed)
Kirsten Poole, D.O.  ABFM, ABOM Specializing in Clinical Bariatric Medicine  Office located at: [address] [place], [place]  [address]     Assessment and Plan:   Medications Discontinued During This Encounter  Medication Reason   Vitamin D, Ergocalciferol, (DRISDOL) 1.25 MG (50000 UNIT) CAPS capsule Reorder    Meds ordered this encounter  Medications   Vitamin D, Ergocalciferol, (DRISDOL) 1.25 MG (50000 UNIT) CAPS capsule    Sig: Take 1 capsule (50,000 Units total) by mouth every 7 (seven) days.    Dispense:  4 capsule    Refill:  0    30 days  Other disorder of eating - Emotional Eating Assessment: Condition is Not optimized.. Denies any SI/HI.  Cravings and hunger are not well controlled. Pt endorses noticing she eats during times of stress, boredom, and a rewards such as eating with her kids. She has noticed that even when walking in the kitchen she finds herself snacking.   Plan: - Patient was referred to Dr. Dewaine Conger, our Bariatric Psychologist, for evaluation due to her significant struggles with emotional eating. Pt agrees to meet with Dr. Dewaine Conger at this time.   - We discussed the option of medications to help control her emotional eating such Wellbutrin as an adjunct therapy. We discussed what it is, the risks, and benefits. She denies wanting to start at this time  - Discussed how thoughts affect eating habits, modeling of thoughts, feelings, and behaviors, and strategies for change.  Importance of not skipping meals and getting all her proteins and fiber in on a daily basis discussed.    - Discussed cognitive distortions, coping thoughts, and how to change our thoughts/ self talk regarding foods/ eating patterns.  - Educational handouts on emotional vs physical hunger given to pt at their request  - Reminded patient of the importance of following their prudent nutrition plan and how food can affect mood as well to support emotional wellbeing.    Vitamin D  deficiency Assessment: Condition is Not optimized.. Patient reports good compliance and tolerance of taking Ergocalciferol 50K IU. She denies any adverse side effects.  Lab Results  Component Value Date   VD25OH 17.9 (L) 02/23/2023   Plan: - Continue with ERGO at current dose once weekly. I will refill today.  - I reviewed possible symptoms of low Vitamin D:  low energy, depressed mood, muscle aches, joint aches, osteoporosis etc. with patient  - With winter approaching her vitamin D levels are prone to drop so we will need to monitor levels regularly to keep levels within normal limits and prevent over supplementation.   Prediabetes Assessment: Condition is Not optimized.. This is diet/exercise controlled. Pt endorses her hunger and cravings are not well controlled. She noticed snacking at home when in the kitchen.  Lab Results  Component Value Date   HGBA1C 5.7 (H) 12/31/2022   HGBA1C 5.6 12/23/2015   INSULIN 28.1 (H) 02/23/2023    Plan: - Kirsten Poole will continue to work on weight loss, exercise, via their meal plan we devised to help decrease the risk of progressing to diabetes.    - Explained role of simple carbs and insulin levels on hunger and cravings  - Anticipatory guidance given.     TREATMENT PLAN FOR OBESITY: BMI 40.0-44.9, adult (HCC)- current BMI 40.58 Morbid obesity with BMI of 40.0-44.9, adult (HCC), Starting BMI 40.8 Assessment:  Kirsten Poole is here to discuss her progress with her obesity treatment plan along with follow-up of her  obesity related diagnoses. See Medical Weight Management Flowsheet for complete bioelectrical impedance results.  Condition is not optimized. Biometric data collected today, was reviewed with patient.   Since last office visit on 06/13/2023 patient's  Muscle mass has decreased by 0.3lb. Fat mass has increased by 0.4lb. Total body water has decreased by 2.2lb.  Counseling done on how various foods will affect these numbers and how to  maximize success  Total lbs lost to date: 3 Total weight loss percentage to date: 1.29%   Plan: - Continue to follow the category 2 as a guide and journal within the goals of 1200-1300 calories and 90+g protein.   Behavioral Intervention Additional resources provided today:  emotional vs physical hunger and mindful eating handout Evidence-based interventions for health behavior change were utilized today including the discussion of self monitoring techniques, problem-solving barriers and SMART goal setting techniques.   Regarding patient's less desirable eating habits and patterns, we employed the technique of small changes.  Pt will specifically work on: not eat in front of her phone, TV, or computer and take sips of water in between each bite for next visit.    She has agreed to Think about enjoyable ways to increase daily physical activity and overcoming barriers to exercise and Increase physical activity in their day and reduce sedentary time (increase NEAT).   FOLLOW UP: Return in about 2 weeks (around 07/25/2023).  She was informed of the importance of frequent follow up visits to maximize her success with intensive lifestyle modifications for her multiple health conditions.  Subjective:   Chief complaint: Obesity Kirsten Poole is here to discuss her progress with her obesity treatment plan. She is on the keeping a food journal and adhering to recommended goals of 1200-1300 calories and 90+ protein and states she is following her eating plan approximately 75% of the time. She states she is not exercising.  Interval History:  Kirsten Poole is here for a follow up office visit.     Since last office visit:  Pt endorses following the meal plan to the best of her ability, she finds it easier to follow the meal plan in the morning and lunch but not for dinner. She notes making poor choices during the afternoon. She has been journaling her food intake on her phone. She averaged 1500-1700  calories and 70-75g of protein. She has noticed that she follows the plan less on the weekends. Pt endorses that she knows she is making poor health choices especially when eating out, eating with her kids, and snacking.   We reviewed her meal plan and all questions were answered.   Review of Systems:  Pertinent positives were addressed with patient today.  Reviewed by clinician on day of visit: allergies, medications, problem list, medical history, surgical history, family history, social history, and previous encounter notes.  Weight Summary and Biometrics   Weight Lost Since Last Visit: 0lb  Weight Gained Since Last Visit: 1lb   Vitals Temp: 97.9 F (36.6 C) BP: 132/84 Pulse Rate: 68 SpO2: 96 %   Anthropometric Measurements Height: 5\' 3"  (1.6 m) Weight: 229 lb (103.9 kg) BMI (Calculated): 40.58 Weight at Last Visit: 228lb Weight Lost Since Last Visit: 0lb Weight Gained Since Last Visit: 1lb Starting Weight: 232lb Total Weight Loss (lbs): 3 lb (1.361 kg) Peak Weight: 240lb   Body Composition  Body Fat %: 49.6 % Fat Mass (lbs): 113.6 lbs Muscle Mass (lbs): 109.6 lbs Total Body Water (lbs): 89 lbs Visceral Fat Rating : 14  Other Clinical Data Fasting: no Labs: no Today's Visit #: 6 Starting Date: 02/23/23     Objective:   PHYSICAL EXAM: Blood pressure 132/84, pulse 68, temperature 97.9 F (36.6 C), height 5\' 3"  (1.6 m), weight 229 lb (103.9 kg), SpO2 96%. Body mass index is 40.57 kg/m.  General: Well Developed, well nourished, and in no acute distress.  HEENT: Normocephalic, atraumatic Skin: Warm and dry, cap RF less 2 sec, good turgor Chest:  Normal excursion, shape, no gross abn Respiratory: speaking in full sentences, no conversational dyspnea NeuroM-Sk: Ambulates w/o assistance, moves * 4 Psych: A and O *3, insight good, mood-full  DIAGNOSTIC DATA REVIEWED:  BMET    Component Value Date/Time   NA 141 12/31/2022 1550   NA 138 12/23/2015  1146   K 4.3 12/31/2022 1550   CL 104 12/31/2022 1550   CO2 26 12/31/2022 1550   GLUCOSE 81 12/31/2022 1550   BUN 17 12/31/2022 1550   BUN 14 12/23/2015 1146   CREATININE 1.14 (H) 12/31/2022 1550   CALCIUM 9.5 12/31/2022 1550   GFRNONAA >60 10/19/2020 1140   GFRAA 112 12/23/2015 1146   Lab Results  Component Value Date   HGBA1C 5.7 (H) 12/31/2022   HGBA1C 5.6 12/23/2015   Lab Results  Component Value Date   INSULIN 28.1 (H) 02/23/2023   Lab Results  Component Value Date   TSH 1.57 12/31/2022   CBC    Component Value Date/Time   WBC 8.7 12/31/2022 1550   RBC 4.82 12/31/2022 1550   HGB 13.5 12/31/2022 1550   HCT 40.5 12/31/2022 1550   PLT 401 (H) 12/31/2022 1550   MCV 84.0 12/31/2022 1550   MCH 28.0 12/31/2022 1550   MCHC 33.3 12/31/2022 1550   RDW 13.3 12/31/2022 1550   Iron Studies No results found for: "IRON", "TIBC", "FERRITIN", "IRONPCTSAT" Lipid Panel     Component Value Date/Time   CHOL 168 12/31/2022 1550   CHOL 149 12/23/2015 1146   TRIG 202 (H) 12/31/2022 1550   HDL 43 (L) 12/31/2022 1550   HDL 43 12/23/2015 1146   CHOLHDL 3.9 12/31/2022 1550   LDLCALC 96 12/31/2022 1550   Hepatic Function Panel     Component Value Date/Time   PROT 6.6 12/31/2022 1550   PROT 6.7 12/23/2015 1146   ALBUMIN 3.8 10/19/2020 1140   ALBUMIN 4.1 12/23/2015 1146   AST 16 12/31/2022 1550   ALT 16 12/31/2022 1550   ALKPHOS 59 10/19/2020 1140   BILITOT 0.3 12/31/2022 1550   BILITOT 0.3 12/23/2015 1146      Component Value Date/Time   TSH 1.57 12/31/2022 1550   Nutritional Lab Results  Component Value Date   VD25OH 17.9 (L) 02/23/2023    Attestations:   I, Clinical biochemist, acting as a Stage manager for [employer], DO., have compiled all relevant documentation for today's office visit on behalf of Thomasene Lot, DO, while in the presence of [employer], DO.  I have reviewed the above documentation for accuracy and completeness, and I agree with the  above. Kirsten Poole, D.O.  The 21st Century Cures Act was signed into law in 2016 which includes the topic of electronic health records.  This provides immediate access to information in MyChart.  This includes consultation notes, operative notes, office notes, lab results and pathology reports.  If you have any questions about what you read please let us know at your next visit so we can discuss your concerns and take corrective action if need  be.  We are right here with you.

## 2023-07-25 ENCOUNTER — Encounter (INDEPENDENT_AMBULATORY_CARE_PROVIDER_SITE_OTHER): Payer: Self-pay | Admitting: Family Medicine

## 2023-07-25 ENCOUNTER — Ambulatory Visit (INDEPENDENT_AMBULATORY_CARE_PROVIDER_SITE_OTHER): Payer: BC Managed Care – PPO | Admitting: Family Medicine

## 2023-07-25 VITALS — BP 126/86 | HR 72 | Temp 97.9°F | Ht 63.0 in | Wt 227.0 lb

## 2023-07-25 DIAGNOSIS — E559 Vitamin D deficiency, unspecified: Secondary | ICD-10-CM | POA: Diagnosis not present

## 2023-07-25 DIAGNOSIS — Z6841 Body Mass Index (BMI) 40.0 and over, adult: Secondary | ICD-10-CM | POA: Insufficient documentation

## 2023-07-25 DIAGNOSIS — R7303 Prediabetes: Secondary | ICD-10-CM | POA: Diagnosis not present

## 2023-07-25 DIAGNOSIS — F5089 Other specified eating disorder: Secondary | ICD-10-CM | POA: Diagnosis not present

## 2023-07-25 DIAGNOSIS — F509 Eating disorder, unspecified: Secondary | ICD-10-CM | POA: Insufficient documentation

## 2023-07-25 NOTE — Progress Notes (Signed)
Kirsten Poole, D.O.  ABFM, ABOM Specializing in Clinical Bariatric Medicine  Office located at: [address] [place], [place]  [address]     Assessment and Plan:    Vitamin D deficiency Assessment: Condition is Not optimized.Marland Kitchen Pt vitamin D level  Lab Results  Component Value Date   VD25OH 17.9 (L) 02/23/2023   Plan: - Continue Ergocalciferol 50K IU weekly. She denies need for refill.   - I reviewed possible symptoms of low Vitamin D: low energy and depressed mood.  - weight loss will likely improve availability of vitamin D, thus encouraged Kirsten Poole to continue with meal plan and their weight loss efforts to further improve this condition.    Prediabetes Assessment: Condition is Not optimized.. This is diet/exercise controlled. Pt endorses Kirsten Poole hunger and cravings improving but not fully disappearing. Pt has worked on controlling Kirsten Poole snacking habits.  Lab Results  Component Value Date   HGBA1C 5.7 (H) 12/31/2022   HGBA1C 5.6 12/23/2015   INSULIN 28.1 (H) 02/23/2023    Plan: - Continue Kirsten Poole prudent nutritional plan that is low in simple carbohydrates, saturated fats and exercise, via their meal plan we devised to help decrease the risk of progressing to diabetes. Pt encouraged to continually advance exercise and cardiovascular fitness as tolerated throughout weight loss journey.  - Continue to decrease simple carbs/ sugars; increase fiber and proteins -> follow Kirsten Poole meal plan.    - Anticipatory guidance given.    - We will recheck A1c and fasting insulin level in approximately 3 months from last check, or as deemed appropriate.    Other disorder of eating - Emotional Eating Assessment: Condition is Improving, but not optimized.. Denies any SI/HI. Mood is stable. She takes no medications for this.  Cravings and hunger are well controlled. Pt informed me that she has had a discussion with Kirsten Poole husband about putting dinner away before she gets home to help Kirsten Poole in cleaning  and temptation of eating. She has an upcoming appt with Dr. Dewaine Conger.   Plan: - Continue to meet with Dr. Dewaine Conger prn.   - I discussed coping thoughts and how to change our thoughts/ self talk regarding foods/ eating patterns. I informed Kirsten Poole that when feeling full and satisfied this can help control Kirsten Poole emotional eating.   - No SI/ HI.  Mood stable currently  - Behavior modification techniques were discussed today to help deal with emotional/ non-hunger eating behaviors including but not limited to exercise for stress management, meditation/prayer, behavorial sessions with Kirsten Poole therapist and self care activities like adequate sleep (7-9 hrs/nite).  - Reminded patient of the importance of following their prudent nutrition plan and how food can affect mood as well to support emotional wellbeing. We will continue to monitor closely alongside PCP.   TREATMENT PLAN FOR OBESITY: BMI 40.0-44.9, adult (HCC)- current BMI 40.22 Morbid obesity with BMI of 40.0-44.9, adult (HCC), Starting BMI 40.8 Assessment:  Kirsten Poole is here to discuss Kirsten Poole progress with Kirsten Poole obesity treatment plan along with follow-up of Kirsten Poole obesity related diagnoses. See Medical Weight Management Flowsheet for complete bioelectrical impedance results.  Condition is docourse: improving. Biometric data collected today, was reviewed with patient.   Since last office visit on 07/11/2023 patient's  Muscle mass has increased by 2.8lb. Fat mass has decreased by 4.4lb. Total body water has decreased by 3.2lb.  Counseling done on how various foods will affect these numbers and how to maximize success  Total lbs lost to date: 5 Total weight  loss percentage to date: 2.16%   Plan: - Kirsten Poole is currently in the action stage of change and will continue to adhere to the category 2 as a guide and journal within the goals of 1200-1300 calories and 90+g protein.   Behavioral Intervention Additional resources provided today: category 2 meal plan  information Evidence-based interventions for health behavior change were utilized today including the discussion of self monitoring techniques, problem-solving barriers and SMART goal setting techniques.   Regarding patient's less desirable eating habits and patterns, we employed the technique of small changes.  Pt will specifically work on: think about ways to make Kirsten Poole lunch into 2 smaller meals, increase water intake, and she can start walking if she would like for next visit.     She has agreed to Think about enjoyable ways to increase daily physical activity and overcoming barriers to exercise and Increase physical activity in their day and reduce sedentary time (increase NEAT).   FOLLOW UP: Return in about 22 days (around 08/16/2023).  She was informed of the importance of frequent follow up visits to maximize Kirsten Poole success with intensive lifestyle modifications for Kirsten Poole multiple health conditions.  Subjective:   Chief complaint: Obesity Kirsten Poole is here to discuss Kirsten Poole progress with Kirsten Poole obesity treatment plan. She is on the the Category 2 Plan and keeping a food journal and adhering to recommended goals of 1200-1300 calories and 90+ protein and states she is following Kirsten Poole eating plan approximately 75% of the time. She states she is not exercising.  Interval History:  Kirsten Poole is here for a follow up office visit.     Since last office visit:  Pt informed me that she has been doing better since last OV. She has tried the recommended chicken salad since last OV and enjoys this. To ensure she gets enough protein she has been meal prepping egg muffins in the morning. She endorses needing to increase Kirsten Poole water intake. Kirsten Poole and Kirsten Poole husband try to meal prep meals together for future use.   We reviewed Kirsten Poole meal plan and all questions were answered.   Review of Systems:  Pertinent positives were addressed with patient today.  Reviewed by clinician on day of visit: allergies, medications,  problem list, medical history, surgical history, family history, social history, and previous encounter notes.  Weight Summary and Biometrics   Weight Lost Since Last Visit: 2lb  Weight Gained Since Last Visit: 0lb   Vitals Temp: 97.9 F (36.6 C) BP: 126/86 Pulse Rate: 72 SpO2: 97 %   Anthropometric Measurements Height: 5\' 3"  (1.6 m) Weight: 227 lb (103 kg) BMI (Calculated): 40.22 Weight at Last Visit: 229lb Weight Lost Since Last Visit: 2lb Weight Gained Since Last Visit: 0lb Starting Weight: 232lb Total Weight Loss (lbs): 5 lb (2.268 kg) Peak Weight: 240lb   Body Composition  Body Fat %: 48 % Fat Mass (lbs): 109.2 lbs Muscle Mass (lbs): 112.4 lbs Total Body Water (lbs): 85.8 lbs Visceral Fat Rating : 14   Other Clinical Data Fasting: No Labs: No Today's Visit #: 7 Starting Date: 02/23/23     Objective:   PHYSICAL EXAM: Blood pressure 126/86, pulse 72, temperature 97.9 F (36.6 C), height 5\' 3"  (1.6 m), weight 227 lb (103 kg), SpO2 97%. Body mass index is 40.21 kg/m.  General: Well Developed, well nourished, and in no acute distress.  HEENT: Normocephalic, atraumatic Skin: Warm and dry, cap RF less 2 sec, good turgor Chest:  Normal excursion, shape, no gross abn Respiratory: speaking  in full sentences, no conversational dyspnea NeuroM-Sk: Ambulates w/o assistance, moves * 4 Psych: A and O *3, insight good, mood-full  DIAGNOSTIC DATA REVIEWED:  BMET    Component Value Date/Time   NA 141 12/31/2022 1550   NA 138 12/23/2015 1146   K 4.3 12/31/2022 1550   CL 104 12/31/2022 1550   CO2 26 12/31/2022 1550   GLUCOSE 81 12/31/2022 1550   BUN 17 12/31/2022 1550   BUN 14 12/23/2015 1146   CREATININE 1.14 (H) 12/31/2022 1550   CALCIUM 9.5 12/31/2022 1550   GFRNONAA >60 10/19/2020 1140   GFRAA 112 12/23/2015 1146   Lab Results  Component Value Date   HGBA1C 5.7 (H) 12/31/2022   HGBA1C 5.6 12/23/2015   Lab Results  Component Value Date    INSULIN 28.1 (H) 02/23/2023   Lab Results  Component Value Date   TSH 1.57 12/31/2022   CBC    Component Value Date/Time   WBC 8.7 12/31/2022 1550   RBC 4.82 12/31/2022 1550   HGB 13.5 12/31/2022 1550   HCT 40.5 12/31/2022 1550   PLT 401 (H) 12/31/2022 1550   MCV 84.0 12/31/2022 1550   MCH 28.0 12/31/2022 1550   MCHC 33.3 12/31/2022 1550   RDW 13.3 12/31/2022 1550   Iron Studies No results found for: "IRON", "TIBC", "FERRITIN", "IRONPCTSAT" Lipid Panel     Component Value Date/Time   CHOL 168 12/31/2022 1550   CHOL 149 12/23/2015 1146   TRIG 202 (H) 12/31/2022 1550   HDL 43 (L) 12/31/2022 1550   HDL 43 12/23/2015 1146   CHOLHDL 3.9 12/31/2022 1550   LDLCALC 96 12/31/2022 1550   Hepatic Function Panel     Component Value Date/Time   PROT 6.6 12/31/2022 1550   PROT 6.7 12/23/2015 1146   ALBUMIN 3.8 10/19/2020 1140   ALBUMIN 4.1 12/23/2015 1146   AST 16 12/31/2022 1550   ALT 16 12/31/2022 1550   ALKPHOS 59 10/19/2020 1140   BILITOT 0.3 12/31/2022 1550   BILITOT 0.3 12/23/2015 1146      Component Value Date/Time   TSH 1.57 12/31/2022 1550   Nutritional Lab Results  Component Value Date   VD25OH 17.9 (L) 02/23/2023    Attestations:   I, Clinical biochemist, acting as a Stage manager for [employer], DO., have compiled all relevant documentation for today's office visit on behalf of Thomasene Lot, DO, while in the presence of [employer], DO.  I have reviewed the above documentation for accuracy and completeness, and I agree with the above. Kirsten Poole, D.O.  The 21st Century Cures Act was signed into law in 2016 which includes the topic of electronic health records.  This provides immediate access to information in MyChart.  This includes consultation notes, operative notes, office notes, lab results and pathology reports.  If you have any questions about what you read please let us know at your next visit so we can discuss your concerns and take  corrective action if need be.  We are right here with you.

## 2023-08-08 ENCOUNTER — Telehealth (INDEPENDENT_AMBULATORY_CARE_PROVIDER_SITE_OTHER): Payer: BC Managed Care – PPO | Admitting: Psychology

## 2023-08-08 DIAGNOSIS — F5089 Other specified eating disorder: Secondary | ICD-10-CM

## 2023-08-08 NOTE — Progress Notes (Signed)
Office: [phone]  /  Fax: [phone]    Date: August 08, 2023    Appointment Start Time: 8:53am Duration: 40 minutes Provider: Lawerance Cruel, Psy.D. Type of Session: Intake for Individual Therapy  Location of Patient: Parked in car at work (address obtained; safe/private location) Location of Provider: Provider's home (private office) Type of Contact: Telepsychological Visit via MyChart Video Visit  Informed Consent: Prior to proceeding with today's appointment, two pieces of identifying information were obtained. In addition, Blessed's physical location at the time of this appointment was obtained as well a phone number she could be reached at in the event of technical difficulties. [name] and this provider participated in today's telepsychological service.   The provider's role was explained to [employer]. The provider reviewed and discussed issues of confidentiality, privacy, and limits therein (e.g., reporting obligations). In addition to verbal informed consent, written informed consent for psychological services was obtained prior to the initial appointment. Since the clinic is not a 24/7 crisis center, mental health emergency resources were shared and this  provider explained MyChart, e-mail, voicemail, and/or other messaging systems should be utilized only for non-emergency reasons. This provider also explained that information obtained during appointments will be placed in [facility] medical record and relevant information will be shared with other providers at [facility] at any locations for coordination of care. [name] agreed information may be shared with other [facility] providers as needed for coordination of care and by signing the service agreement document, she provided written consent for coordination of care. Prior to initiating telepsychological services, Samadhi completed an informed consent document, which included the development of a safety  plan (i.e., an emergency contact and emergency resources) in the event of an emergency/crisis. Jiyah verbally acknowledged understanding she is ultimately responsible for understanding her insurance benefits for telepsychological and in-person services. This provider also reviewed confidentiality, as it relates to telepsychological services. Tassy  acknowledged understanding that appointments cannot be recorded without both party consent and she is aware she is responsible for securing confidentiality on her end of the session. Shawntell verbally consented to proceed.  Chief Complaint/HPI: Edward was referred by Dr. Thomasene Lot due to  "other disorder of eating-emotional eating" . Per the note for the visit with Dr. Thomasene Lot on 07/11/2023, "Assessment: Condition is Not optimized. Denies any SI/HI. Cravings and hunger are not well controlled. Pt endorses noticing she eats during times of stress, boredom, and a rewards such as eating with her kids. She has noticed that even when walking in the kitchen she finds herself snacking."   During today's appointment, Shalissa was verbally administered a questionnaire assessing various behaviors related to emotional eating behaviors. Karolyne endorsed the following: experience food cravings on a regular basis, eat certain foods when you are anxious, stressed, depressed, or your feelings are hurt, use food to help you cope with emotional situations, find food is comforting to you, overeat when you are worried about something, overeat frequently when you are bored or lonely, not worry about what you eat when you are in a good mood, and eat as a reward. She shared she craves ice cream and hot "sugar filled drink" on a cold day. Leida believes the onset of emotional eating behaviors was likely in childhood and described the current frequency of emotional eating behaviors as multiple times a week, adding it is typically after work. In addition, Carime denied a history of binge  eating behaviors. Ensleigh denied a history of significantly restricting  food intake, purging and engagement in other compensatory strategies for weight loss, and has never been diagnosed with an eating disorder. She also denied a history of treatment for emotional eating behaviors. Currently, Khristal indicated she is prescribed a structured meal plan (Cat 2).   Mental Status Examination:  Appearance: neat Behavior: appropriate to circumstances Mood: neutral Affect: mood congruent Speech: WNL Eye Contact: appropriate Psychomotor Activity: WNL Gait: unable to assess  Thought Process: linear, logical, and goal directed and denies suicidal, homicidal, and self-harm ideation, plan and intent  Thought Content/Perception: no hallucinations, delusions, bizarre thinking or behavior endorsed or observed Orientation: AAOx4 Memory/Concentration: intact Insight/Judgment: fair  Family & Psychosocial History: Nastacia reported she is married and she has four adult children. She indicated she is currently employed [employer] as a Scientist, physiological with a high school and she is also enrolled [employer] pursuing a bachelor's degree in education. Currently, Zanita's social support system consists of her husband, children, and mother. Moreover, Dellar stated she resides with her husband and children.   Medical History:  Past Medical History:  Diagnosis Date   History of swelling of feet    Hypertension    Migraine    Seasonal allergies    UTI (lower urinary tract infection)    Past Surgical History:  Procedure Laterality Date   WISDOM TOOTH EXTRACTION     Current Outpatient Medications on File Prior to Visit  Medication Sig Dispense Refill   omeprazole (PRILOSEC) 20 MG capsule 20 mg daily.     Vitamin D, Ergocalciferol, (DRISDOL) 1.25 MG (50000 UNIT) CAPS capsule Take 1 capsule (50,000 Units total) by mouth every 7 (seven) days. 4 capsule 0   No current facility-administered medications on file prior to visit.  Elya stated she  is medication compliant.   Mental Health History: Dorice reported she has never attended therapeutic services. She denied a history of psychotropic medications. Lissete reported there is no history of hospitalizations for psychiatric concerns. Kielee denied a family history of mental health/substance abuse related concerns. Furthermore, [name] reported there is no history of trauma including psychological, physical , and sexual abuse, as well as neglect.   Shelena described her typical mood lately as "good, excited" due to holidays. Johnelle endorsed "occasional" alcohol use (approx. 1 standard drink 1x a month). She denied tobacco use. She denied illicit/recreational substance use. Furthermore, [name] indicated she is not experiencing the following: hallucinations and delusions, paranoia, symptoms of mania , social withdrawal, crying spells, panic attacks, memory concerns, attention and concentration issues, and obsessions and compulsions. She also denied history of and current suicidal ideation, plan, and intent; history of and current homicidal ideation, plan, and intent; and history of and current engagement in self-harm.  Legal History: Yadirah reported there is no history of legal involvement.   Structured Assessments Results: The Patient Health Questionnaire-9 (PHQ-9) is a self-report measure that assesses symptoms and severity of depression over the course of the last two weeks. Asya obtained a score of 0. [0= Not at all; 1= Several days; 2= More than half the days; 3= Nearly every day] Little interest or pleasure in doing things 0  Feeling down, depressed, or hopeless 0  Trouble falling or staying asleep, or sleeping too much 0  Feeling tired or having little energy 0  Poor appetite or overeating 0  Feeling bad about yourself --- or that you are a failure or have let yourself or your family down 0  Trouble concentrating on things, such as reading the newspaper or watching television  0  Moving or  speaking so slowly that other people could have noticed? Or the opposite --- being so fidgety or restless that you have been moving around a lot more than usual 0  Thoughts that you would be better off dead or hurting yourself in some way 0  PHQ-9 Score 0    The Generalized Anxiety Disorder-7 (GAD-7) is a brief self-report measure that assesses symptoms of anxiety over the course of the last two weeks. Sevana obtained a score of 0. [0= Not at all; 1= Several days; 2= Over half the days; 3= Nearly every day] Feeling nervous, anxious, on edge 0  Not being able to stop or control worrying 0  Worrying too much about different things 0  Trouble relaxing 0  Being so restless that it's hard to sit still 0  Becoming easily annoyed or irritable 0  Feeling afraid as if something awful might happen 0  GAD-7 Score 0   Interventions:  Conducted a chart review Focused on rapport building Verbally administered PHQ-9 and GAD-7 for symptom monitoring Verbally administered Food & Mood questionnaire to assess various behaviors related to emotional eating Provided emphatic reflections and validation Psychoeducation provided regarding physical versus emotional hunger  Diagnostic Impressions & Provisional DSM-5 Diagnosis(es): Keiana discussed a history of engagement in emotional eating behaviors and believes the onset of emotional eating behaviors was likely in childhood. She described the current frequency of emotional eating behaviors as multiple times a week, adding it is typically after work. Based on the aforementioned, the following diagnosis was assigned: F50.89 Other Specified Feeding or Eating Disorder, Emotional Eating Behaviors.  Plan: Lilah appears able and willing to participate as evidenced by engagement in reciprocal conversation and asking questions as needed for clarification. The next appointment is scheduled for 08/29/2023 at 10am, which will be via MyChart Video Visit. The following treatment  goal was established: increase coping skills. This provider will regularly review the treatment plan and medical chart to keep informed of status changes. Danijah expressed understanding and agreement with the initial treatment plan of care. Laena will be sent a handout via e-mail to utilize between now and the next appointment to increase awareness of hunger patterns and subsequent eating. [name] provided verbal consent during today's appointment for this provider to send the handout via e-mail.

## 2023-08-16 ENCOUNTER — Encounter (INDEPENDENT_AMBULATORY_CARE_PROVIDER_SITE_OTHER): Payer: Self-pay | Admitting: Family Medicine

## 2023-08-16 ENCOUNTER — Ambulatory Visit (INDEPENDENT_AMBULATORY_CARE_PROVIDER_SITE_OTHER): Payer: BC Managed Care – PPO | Admitting: Family Medicine

## 2023-08-16 ENCOUNTER — Telehealth (INDEPENDENT_AMBULATORY_CARE_PROVIDER_SITE_OTHER): Payer: Self-pay

## 2023-08-16 VITALS — BP 136/85 | HR 62 | Temp 98.4°F | Ht 63.0 in | Wt 228.0 lb

## 2023-08-16 DIAGNOSIS — R7303 Prediabetes: Secondary | ICD-10-CM | POA: Diagnosis not present

## 2023-08-16 DIAGNOSIS — E559 Vitamin D deficiency, unspecified: Secondary | ICD-10-CM | POA: Diagnosis not present

## 2023-08-16 DIAGNOSIS — Z6841 Body Mass Index (BMI) 40.0 and over, adult: Secondary | ICD-10-CM

## 2023-08-16 MED ORDER — VITAMIN D (ERGOCALCIFEROL) 1.25 MG (50000 UNIT) PO CAPS
50000.0000 [IU] | ORAL_CAPSULE | ORAL | 0 refills | Status: DC
Start: 2023-08-16 — End: 2023-10-12

## 2023-08-16 NOTE — Telephone Encounter (Signed)
Called to offer early appt due to cancellations. Left message

## 2023-08-16 NOTE — Progress Notes (Signed)
Kirsten Poole, D.O.  ABFM, ABOM Specializing in Clinical Bariatric Medicine  Office located at: [address] [name], [place]  [address]   Assessment and Plan:   FOR THE DISEASE OF OBESITY: BMI 40.0-44.9, adult (HCC)- current BMI 40.4 Morbid obesity with BMI of 40.0-44.9, adult (HCC), Starting BMI 40.8 Assessment & Plan: Since last office visit on 07/25/23 patient's  Muscle mass has decreased by 3.6 lb. Fat mass has increased by 4.2 lb. Total body water has increased by 2 lb.  Counseling done on how various foods will affect these numbers and how to maximize success  Total lbs lost to date: 4 lbs Total weight loss percentage to date: 1.72%    Recommended Dietary Goals Kirsten Poole is currently in the action stage of change. As such, her goal is to continue weight management plan.  She has agreed to: continue current plan   Behavioral Intervention We discussed the following today: increasing lean protein intake to established goals, decreasing simple carbohydrates, and the determinants of skipping meals   Additional resources provided today:  Handout on Metformin  Evidence-based interventions for health behavior change were utilized today including the discussion of self monitoring techniques, problem-solving barriers and SMART goal setting techniques.   Regarding patient's less desirable eating habits and patterns, we employed the technique of small changes.   Pt will specifically work on: eating all the foods on her meal plan.    Recommended Physical Activity Goals Kirsten Poole has been advised to work up to 150 minutes of moderate intensity aerobic activity a week and strengthening exercises 2-3 times per week for cardiovascular health, weight loss maintenance and preservation of muscle mass.   She has agreed to : Continue current level of physical activity    Pharmacotherapy We both agreed to : continue with nutritional and behavioral strategies   FOR ASSOCIATED  CONDITIONS ADDRESSED TODAY:  Prediabetes Assessment & Plan: Most recent Hemoglobin A1c and fasting insulin:  Lab Results  Component Value Date   HGBA1C 5.7 (H) 12/31/2022   HGBA1C 5.6 12/23/2015   INSULIN 28.1 (H) 02/23/2023    No current meds. Diet/exercise approach. Has been having sweet cravings, which were stronger over the Thanksgiving holidays. She is struggling with getting in her lean proteins.  I stressed the importance of getting in the adequate amounts of lean protein for stabilizing hunger and cravings as well as improving her metabolism. Additionally, I discussed the anticipated benefits of Metformin, which can help decrease the progression of her pre-diabetes and help abate hunger and cravings. Handout on Metformin provided -pt wants to hold off on starting today and will continue with weight loss therapy.    Vitamin D deficiency Assessment & Plan: Most recent vitamin D:  Lab Results  Component Value Date   VD25OH 17.9 (L) 02/23/2023   She is currently on ERGO 50,000 units every 7 days. Continue with weight loss efforts and rx high dose vitamin D. Vitamin D recheck; future.   Orders: -     Vitamin D (Ergocalciferol); Take 1 capsule (50,000 Units total) by mouth every 7 (seven) days.  Dispense: 4 capsule; Refill: 0   Follow up:   Return 09/01/23. She was informed of the importance of frequent follow up visits to maximize her success with intensive lifestyle modifications for her multiple health conditions.  Subjective:   Chief complaint: Obesity Kirsten Poole is here to discuss her progress with her obesity treatment plan. She is keeping a food journal and adhering to recommended goals of 1200-1300  calories and 90+ protein  with the CAT 2 MP as a guide and states she is following her eating plan approximately 65-70% of the time. She states she is walking 15-20 minutes 2 days per week.  Interval History:  Kirsten Poole is here for a follow up office visit. Since last  OV, [name] is up 1 lb. She is struggling with getting in her lean proteins. Has been having sweet cravings, which were stronger over the Thanksgiving holidays. She is somewhat back on track with her meal plan.   Pharmacotherapy for weight loss: She is currently taking no anti-obesity medication.   Review of Systems:  Pertinent positives were addressed with patient today.  Reviewed by clinician on day of visit: allergies, medications, problem list, medical history, surgical history, family history, social history, and previous encounter notes.  Weight Summary and Biometrics   Weight Lost Since Last Visit: 0lb  Weight Gained Since Last Visit: 1lb   Vitals Temp: 98.4 F (36.9 C) BP: 136/85 Pulse Rate: 62 SpO2: 100 %   Anthropometric Measurements Height: 5\' 3"  (1.6 m) Weight: 228 lb (103.4 kg) BMI (Calculated): 40.4 Weight at Last Visit: 227lb Weight Lost Since Last Visit: 0lb Weight Gained Since Last Visit: 1lb Starting Weight: 232lb Total Weight Loss (lbs): 4 lb (1.814 kg) Peak Weight: 240lb   Body Composition  Body Fat %: 49.7 % Fat Mass (lbs): 113.4 lbs Muscle Mass (lbs): 108.8 lbs Total Body Water (lbs): 87.8 lbs Visceral Fat Rating : 14   Other Clinical Data Fasting: No Labs: No Today's Visit #: 8 Starting Date: 02/23/23   Objective:   PHYSICAL EXAM: Blood pressure 136/85, pulse 62, temperature 98.4 F (36.9 C), height 5\' 3"  (1.6 m), weight 228 lb (103.4 kg), SpO2 100%. Body mass index is 40.39 kg/m.  General: she is overweight, cooperative and in no acute distress. PSYCH: Has normal mood, affect and thought process.   HEENT: EOMI, sclerae are anicteric. Lungs: Normal breathing effort, no conversational dyspnea. Extremities: Moves * 4 Neurologic: A and O * 3, good insight  DIAGNOSTIC DATA REVIEWED: BMET    Component Value Date/Time   NA 141 12/31/2022 1550   NA 138 12/23/2015 1146   K 4.3 12/31/2022 1550   CL 104 12/31/2022 1550   CO2 26  12/31/2022 1550   GLUCOSE 81 12/31/2022 1550   BUN 17 12/31/2022 1550   BUN 14 12/23/2015 1146   CREATININE 1.14 (H) 12/31/2022 1550   CALCIUM 9.5 12/31/2022 1550   GFRNONAA >60 10/19/2020 1140   GFRAA 112 12/23/2015 1146   Lab Results  Component Value Date   HGBA1C 5.7 (H) 12/31/2022   HGBA1C 5.6 12/23/2015   Lab Results  Component Value Date   INSULIN 28.1 (H) 02/23/2023   Lab Results  Component Value Date   TSH 1.57 12/31/2022   CBC    Component Value Date/Time   WBC 8.7 12/31/2022 1550   RBC 4.82 12/31/2022 1550   HGB 13.5 12/31/2022 1550   HCT 40.5 12/31/2022 1550   PLT 401 (H) 12/31/2022 1550   MCV 84.0 12/31/2022 1550   MCH 28.0 12/31/2022 1550   MCHC 33.3 12/31/2022 1550   RDW 13.3 12/31/2022 1550   Iron Studies No results found for: "IRON", "TIBC", "FERRITIN", "IRONPCTSAT" Lipid Panel     Component Value Date/Time   CHOL 168 12/31/2022 1550   CHOL 149 12/23/2015 1146   TRIG 202 (H) 12/31/2022 1550   HDL 43 (L) 12/31/2022 1550   HDL 43 12/23/2015  1146   CHOLHDL 3.9 12/31/2022 1550   LDLCALC 96 12/31/2022 1550   Hepatic Function Panel     Component Value Date/Time   PROT 6.6 12/31/2022 1550   PROT 6.7 12/23/2015 1146   ALBUMIN 3.8 10/19/2020 1140   ALBUMIN 4.1 12/23/2015 1146   AST 16 12/31/2022 1550   ALT 16 12/31/2022 1550   ALKPHOS 59 10/19/2020 1140   BILITOT 0.3 12/31/2022 1550   BILITOT 0.3 12/23/2015 1146      Component Value Date/Time   TSH 1.57 12/31/2022 1550   Nutritional Lab Results  Component Value Date   VD25OH 17.9 (L) 02/23/2023    Attestations:   I, Special Puri, acting as a Stage manager for Thomasene Lot, DO., have compiled all relevant documentation for today's office visit on behalf of Thomasene Lot, DO, while in the presence of [employer], DO.  I have reviewed the above documentation for accuracy and completeness, and I agree with the above. Kirsten Poole, D.O.  The 21st Century Cures Act was  signed into law in 2016 which includes the topic of electronic health records.  This provides immediate access to information in MyChart.  This includes consultation notes, operative notes, office notes, lab results and pathology reports.  If you have any questions about what you read please let us know at your next visit so we can discuss your concerns and take corrective action if need be.  We are right here with you.

## 2023-08-29 ENCOUNTER — Telehealth (INDEPENDENT_AMBULATORY_CARE_PROVIDER_SITE_OTHER): Payer: BC Managed Care – PPO | Admitting: Psychology

## 2023-08-29 DIAGNOSIS — F5089 Other specified eating disorder: Secondary | ICD-10-CM | POA: Diagnosis not present

## 2023-08-29 NOTE — Progress Notes (Signed)
  Office: [phone]  /  Fax: [phone]    Date: August 29, 2023  Appointment Start Time: 10:02am Duration: 26 minutes Provider: Lawerance Cruel, Psy.D. Type of Session: Individual Therapy  Location of Patient: Parked in car outside of work (address obtained; safe/private location) Location of Provider: Provider's Home (private office) Type of Contact: Telepsychological Visit via MyChart Video Visit  Session Content: Kirsten Poole is a 47 y.o. female presenting for a follow-up appointment to address the previously established treatment goal of increasing coping skills.Today's appointment was a telepsychological visit. [name] provided verbal consent for today's telepsychological appointment and she is aware she is responsible for securing confidentiality on her end of the session. Prior to proceeding with today's appointment, Tiffancy's physical location at the time of this appointment was obtained as well a phone number she could be reached at in the event of technical difficulties. [name] and this provider participated in today's telepsychological service.   Of note, today's appointment was switched to a regular telephone call at 10:04am with [facility] verbal consent due to technical issues.   This provider conducted a brief check-in. Desiraye discussed paying attention to emotional and physical hunger. She shared examples. Psychoeducation regarding triggers for emotional eating was provided. [name] was provided a handout, and encouraged to utilize the handout between now and the next appointment to increase awareness of triggers and frequency. [name] agreed. This provider also discussed behavioral strategies for specific triggers, such as placing the utensil down when conversing to avoid mindless eating. [name] provided verbal consent during today's appointment for this provider to send a handout about triggers via e-mail. Overall, [name] was receptive to today's appointment as evidenced by openness to sharing,  responsiveness to feedback, and willingness to explore triggers for emotional eating.  Mental Status Examination:  Appearance: neat Behavior: appropriate to circumstances Mood: neutral Affect: mood congruent Speech: WNL Eye Contact: appropriate Psychomotor Activity: WNL Gait: unable to assess Thought Process: linear, logical, and goal directed and no evidence or endorsement of suicidal, homicidal, and self-harm ideation, plan and intent  Thought Content/Perception: no hallucinations, delusions, bizarre thinking or behavior endorsed or observed Orientation: AAOx4 Memory/Concentration: intact Insight: good Judgment: good  Interventions:  Conducted a brief chart review Provided empathic reflections and validation Reviewed content from the previous session Provided positive reinforcement Employed supportive psychotherapy interventions to facilitate reduced distress and to improve coping skills with identified stressors Psychoeducation provided regarding triggers for emotional eating behaviors  DSM-5 Diagnosis(es): F50.89 Other Specified Feeding or Eating Disorder, Emotional Eating Behaviors  Treatment Goal & Progress: During the initial appointment with this provider, the following treatment goal was established: increase coping skills. [name] has demonstrated progress in her goal as evidenced by increased awareness of hunger patterns.   Plan: The next appointment is scheduled for 09/20/2023 at 4pm, which will be via MyChart Video Visit. The next session will focus on working towards the established treatment goal.

## 2023-09-01 ENCOUNTER — Ambulatory Visit (INDEPENDENT_AMBULATORY_CARE_PROVIDER_SITE_OTHER): Payer: BC Managed Care – PPO | Admitting: Family Medicine

## 2023-09-20 ENCOUNTER — Telehealth (INDEPENDENT_AMBULATORY_CARE_PROVIDER_SITE_OTHER): Payer: 59 | Admitting: Psychology

## 2023-09-20 DIAGNOSIS — F5089 Other specified eating disorder: Secondary | ICD-10-CM | POA: Diagnosis not present

## 2023-09-20 NOTE — Progress Notes (Signed)
  Office: [phone]  /  Fax: [phone]    Date: September 20, 2023  Appointment Start Time: 4:01pm Duration: 31 minutes Provider: Wyatt Fire, Psy.D. Type of Session: Individual Therapy  Location of Patient: Home (private location) Location of Provider: Provider's Home (private office) Type of Contact: Telepsychological Visit via MyChart Video Visit  Session Content: Kirsten Poole is a 48 y.o. female presenting for a follow-up appointment to address the previously established treatment goal of increasing coping skills.Today's appointment was a telepsychological visit. Kirsten Poole provided verbal consent for today's telepsychological appointment and she is aware she is responsible for securing confidentiality on her end of the session. Prior to proceeding with today's appointment, Kirsten Poole's physical location at the time of this appointment was obtained as well a phone number she could be reached at in the event of technical difficulties. Kirsten Poole and this provider participated in today's telepsychological service.   This provider conducted a brief check-in. Kirsten Poole reflected on the holidays, specifically as it relates to eating. She described feeling tired and disappointed with her progress. Psychoeducation provided regarding self-compassion. Kirsten Poole was engaged in a self-compassion exercise to help with eating-related challenges and other ongoing stressors. She was encouraged to regularly ask herself, "What do I need right now?" and How can I comfort and care for myself in this moment? Overall, Overall, Kirsten Poole was receptive to today's appointment as evidenced by openness to sharing, responsiveness to feedback, and willingness to work toward increasing self-compassion.  Mental Status Examination:  Appearance: neat Behavior: appropriate to circumstances Mood: sad Affect: mood congruent and tearful Speech: WNL Eye Contact: appropriate Psychomotor Activity: WNL Gait: unable to assess Thought Process: linear,  logical, and goal directed and no evidence or endorsement of suicidal, homicidal, and self-harm ideation, plan and intent  Thought Content/Perception: no hallucinations, delusions, bizarre thinking or behavior endorsed or observed Orientation: AAOx4 Memory/Concentration: intact Insight: fair Judgment: fair  Interventions:  Conducted a brief chart review Provided empathic reflections and validation Provided positive reinforcement Employed supportive psychotherapy interventions to facilitate reduced distress and to improve coping skills with identified stressors Psychoeducation provided regarding self-compassion Engaged pt in a self-compassion exercise  DSM-5 Diagnosis(es): F50.89 Other Specified Feeding or Eating Disorder, Emotional Eating Behaviors  Treatment Goal & Progress: During the initial appointment with this provider, the following treatment goal was established: increase coping skills. Kirsten Poole has demonstrated progress in her goal as evidenced by increased awareness of hunger patterns and triggers for emotional eating behaviors.   Plan: The next appointment is scheduled for 10/18/2023 at 8am, which will be via MyChart Video Visit. The next session will focus on working towards the established treatment goal.   Wyatt Fire, PsyD

## 2023-09-28 ENCOUNTER — Ambulatory Visit (INDEPENDENT_AMBULATORY_CARE_PROVIDER_SITE_OTHER): Payer: 59 | Admitting: Physician Assistant

## 2023-10-12 ENCOUNTER — Ambulatory Visit (INDEPENDENT_AMBULATORY_CARE_PROVIDER_SITE_OTHER): Payer: 59 | Admitting: Family Medicine

## 2023-10-12 ENCOUNTER — Encounter (INDEPENDENT_AMBULATORY_CARE_PROVIDER_SITE_OTHER): Payer: Self-pay | Admitting: Family Medicine

## 2023-10-12 VITALS — BP 122/89 | HR 80 | Temp 98.1°F | Ht 63.0 in | Wt 231.0 lb

## 2023-10-12 DIAGNOSIS — E559 Vitamin D deficiency, unspecified: Secondary | ICD-10-CM | POA: Diagnosis not present

## 2023-10-12 DIAGNOSIS — F5089 Other specified eating disorder: Secondary | ICD-10-CM

## 2023-10-12 DIAGNOSIS — Z6841 Body Mass Index (BMI) 40.0 and over, adult: Secondary | ICD-10-CM

## 2023-10-12 DIAGNOSIS — R7303 Prediabetes: Secondary | ICD-10-CM | POA: Diagnosis not present

## 2023-10-12 MED ORDER — VITAMIN D (ERGOCALCIFEROL) 1.25 MG (50000 UNIT) PO CAPS: 50000.0000 [IU] | ORAL_CAPSULE | ORAL | 0 refills | Status: DC

## 2023-10-12 NOTE — Progress Notes (Signed)
Kirsten Poole, D.O.  ABFM, ABOM Specializing in Clinical Bariatric Medicine  Office located at: [address] [place], [place]  [address]   Assessment and Plan:   FOR THE DISEASE OF OBESITY: BMI 40.0-44.9, adult (HCC)- current BMI 40.93 Morbid obesity with BMI of 40.0-44.9, adult (HCC), Starting BMI 40.8 Assessment & Plan: Since last office visit on 08/16/23 patient's muscle mass has increased by 2lb. Fat mass has increased by 1lb. Total body water has increased by 2lb.  Counseling done on how various foods will affect these numbers and how to maximize success  Total lbs lost to date: 1 lb Total weight loss percentage to date: -0.43 %   Recommended Dietary Goals Garnetta is currently in the action stage of change. As such, her goal is to continue weight management plan. Educated pt on needing to be in 500-600 calorie deficit to lose 1 lb per week.   She has agreed to: continue current plan   Behavioral Intervention We discussed the following today: increasing lean protein intake to established goals, decreasing simple carbohydrates , increasing vegetables, avoiding skipping meals, increasing water intake , work on meal planning and preparation, work on Counselling psychologist calories using tracking application, keeping healthy foods at home, practice mindfulness eating and understand the difference between hunger signals and cravings, work on managing stress, creating time for self-care and relaxation, and avoiding temptations and identifying enticing environmental cues  Additional resources provided today: Personalized instruction on the use of artificial intelligence for recipes, calorie tracking, and finding healthier options when eating out.  and Recipes and Handout on slow cooker recipes   Evidence-based interventions for health behavior change were utilized today including the discussion of self monitoring techniques, problem-solving barriers and SMART goal setting  techniques.   Regarding patient's less desirable eating habits and patterns, we employed the technique of small changes.   Pt will specifically work on: Meal prepping/planning on Sundays and working on staying on track for dinner for next visit.    Recommended Physical Activity Goals Emilee has been advised to work up to 150 minutes of moderate intensity aerobic activity a week and strengthening exercises 2-3 times per week for cardiovascular health, weight loss maintenance and preservation of muscle mass.   She has agreed to :  Think about enjoyable ways to increase daily physical activity and overcoming barriers to exercise and Increase physical activity in their day and reduce sedentary time (increase NEAT).   Pharmacotherapy We discussed various medication options to help [facility] with her weight loss efforts and we both agreed to : continue with nutritional and behavioral strategies   FOR ASSOCIATED CONDITIONS ADDRESSED TODAY: Prediabetes Assessment & Plan: Lab Results  Component Value Date   HGBA1C 5.7 (H) 12/31/2022   HGBA1C 5.6 12/23/2015   INSULIN 28.1 (H) 02/23/2023   Pt states she has been on Adipex (phentremine) before, which has worked while on them, but she doesn't change her habits. This is something that she would like to work on in an effort to avoid progression from prediabetes to diabetes. Has tried to focus more on eating proteins, meal preps egg muffins for breakfast. Still struggles to meet her daily protein goal, especially for dinner. Drinks water throughout the day.   Reviewed the benefits of mediations (such as Metformin, Wellbutrin, etc.) helping to decrease cravings and hunger. Encouraged pt to continue to meal prep and plan to help her stay on plan and increase protein. Advised her to decrease simple carb/sugars and increase protein  intake to help hunger and cravings. Stay properly hydrated. Recommended pt to research on the American Diabetes Association website  for more information.    Other disorder of eating - Emotional Eating Assessment & Plan: Moods are stable currently. Denies any SI/HI. Pt is established with Dr. Dewaine Conger and continues to follow up with her. Has good support from husband as he is also trying to eat healthy and lose weight. She does, however, have to label her meal prepped foods (egg muffins, etc.) to avoid her kids from eating it.   Continue following up with Dr. Dewaine Conger. Encouraged pt to continue meal prepping and planning to keep her on-plan foods available when she needs them. Will continue to monitor condition.    Vitamin D deficiency Assessment & Plan: Lab Results  Component Value Date   VD25OH 17.9 (L) 02/23/2023   Last vitamin D was well below goal at 17.9 on 02/23/2023. Pt has been out of ERGO and needs a refill today. Was previously on ERGO 50K units once weekly prior to running out.   Reviewed ideal vitamin D levels of 50-70 with pt. Discussed the importance of vitamin D. Will refill ERGO today, pt compliance encouraged.   Orders: - Refill ERGO today, no dose changes.    Follow up:   Return in about 3 weeks (around 11/02/2023). She was informed of the importance of frequent follow up visits to maximize her success with intensive lifestyle modifications for her multiple health conditions.  Subjective:   Chief complaint: Obesity [name] is here to discuss her progress with her obesity treatment plan. She is keeping a food journal and adhering to recommended goals of 1200-1300 calories and 90+ g of  protein with the Cat 2 meal plan as a guide and states she is following her eating plan approximately 50% of the time. She states she is walking with 2 lbs hand weights 10-20 minutes 4 days per week.  Interval History:  [name] is here for a follow up office visit. Since last OV, she is up 3 lbs. She has mainly been journaling for dinner. Pt states she has her morning and lunch routine down, but struggles most  in the evenings/dinner time. Has focused on prioritizing her protein intake. For breakfast she makes egg muffins made from 12 eggs, [place] patties, shredded cheese and is 110 calories and 13 g of protein each. For dinner she struggles when socially spending time with family or when going out with friends. Pt has been journaling and reports there have not been 10-15 consecutive days where she meets both her calorie and protein goals on a daily basis. She also reports some off-plan eating.   Barriers identified: having difficulty with meal prep and planning, having difficulty focusing on healthy eating, exposure to enticing environments and/or relationships, and emotional eating.   Pharmacotherapy for weight loss: She is currently taking no anti-obesity medication.   Review of Systems:  Pertinent positives were addressed with patient today.  Reviewed by clinician on day of visit: allergies, medications, problem list, medical history, surgical history, family history, social history, and previous encounter notes.  Weight Summary and Biometrics   Weight Lost Since Last Visit: 0  Weight Gained Since Last Visit: 3lb    Vitals Temp: 98.1 F (36.7 C) BP: 122/89 Pulse Rate: 80 SpO2: 98 %   Anthropometric Measurements Height: 5\' 3"  (1.6 m) Weight: 231 lb (104.8 kg) BMI (Calculated): 40.93 Weight at Last Visit: 228lb Weight Lost Since Last Visit: 0 Weight Gained Since  Last Visit: 3lb Starting Weight: 232lb Total Weight Loss (lbs): 1 lb (0.454 kg) Peak Weight: 240lb   Body Composition  Body Fat %: 49.5 % Fat Mass (lbs): 114.4 lbs Muscle Mass (lbs): 110.8 lbs Total Body Water (lbs): 89.8 lbs Visceral Fat Rating : 14   Other Clinical Data Fasting: no Labs: no Today's Visit #: 9 Starting Date: 02/23/23    Objective:   PHYSICAL EXAM: Blood pressure 122/89, pulse 80, temperature 98.1 F (36.7 C), height 5\' 3"  (1.6 m), weight 231 lb (104.8 kg), SpO2 98%. Body mass index is  40.92 kg/m.  General: she is overweight, cooperative and in no acute distress. PSYCH: Has normal mood, affect and thought process.   HEENT: EOMI, sclerae are anicteric. Lungs: Normal breathing effort, no conversational dyspnea. Extremities: Moves * 4 Neurologic: A and O * 3, good insight  DIAGNOSTIC DATA REVIEWED: BMET    Component Value Date/Time   NA 141 12/31/2022 1550   NA 138 12/23/2015 1146   K 4.3 12/31/2022 1550   CL 104 12/31/2022 1550   CO2 26 12/31/2022 1550   GLUCOSE 81 12/31/2022 1550   BUN 17 12/31/2022 1550   BUN 14 12/23/2015 1146   CREATININE 1.14 (H) 12/31/2022 1550   CALCIUM 9.5 12/31/2022 1550   GFRNONAA >60 10/19/2020 1140   GFRAA 112 12/23/2015 1146   Lab Results  Component Value Date   HGBA1C 5.7 (H) 12/31/2022   HGBA1C 5.6 12/23/2015   Lab Results  Component Value Date   INSULIN 28.1 (H) 02/23/2023   Lab Results  Component Value Date   TSH 1.57 12/31/2022   CBC    Component Value Date/Time   WBC 8.7 12/31/2022 1550   RBC 4.82 12/31/2022 1550   HGB 13.5 12/31/2022 1550   HCT 40.5 12/31/2022 1550   PLT 401 (H) 12/31/2022 1550   MCV 84.0 12/31/2022 1550   MCH 28.0 12/31/2022 1550   MCHC 33.3 12/31/2022 1550   RDW 13.3 12/31/2022 1550   Iron Studies No results found for: "IRON", "TIBC", "FERRITIN", "IRONPCTSAT" Lipid Panel     Component Value Date/Time   CHOL 168 12/31/2022 1550   CHOL 149 12/23/2015 1146   TRIG 202 (H) 12/31/2022 1550   HDL 43 (L) 12/31/2022 1550   HDL 43 12/23/2015 1146   CHOLHDL 3.9 12/31/2022 1550   LDLCALC 96 12/31/2022 1550   Hepatic Function Panel     Component Value Date/Time   PROT 6.6 12/31/2022 1550   PROT 6.7 12/23/2015 1146   ALBUMIN 3.8 10/19/2020 1140   ALBUMIN 4.1 12/23/2015 1146   AST 16 12/31/2022 1550   ALT 16 12/31/2022 1550   ALKPHOS 59 10/19/2020 1140   BILITOT 0.3 12/31/2022 1550   BILITOT 0.3 12/23/2015 1146      Component Value Date/Time   TSH 1.57 12/31/2022 1550    Nutritional Lab Results  Component Value Date   VD25OH 17.9 (L) 02/23/2023    Attestations:   Burnett Sheng, acting as a medical scribe for Thomasene Lot, DO., have compiled all relevant documentation for today's office visit on behalf of Thomasene Lot, DO, while in the presence of [employer], DO.  Reviewed by clinician on day of visit: allergies, medications, problem list, medical history, surgical history, family history, social history, and previous encounter notes pertinent to patient's obesity diagnosis.  I have spent 40 minutes in the care of the patient today including: preparing to see patient (e.g. review and interpretation of tests, old notes ), obtaining and/or  reviewing separately obtained history, performing a medically appropriate examination or evaluation, counseling and educating the patient, ordering medications, test or procedures, documenting clinical information in the electronic or other health care record, and independently interpreting results and communicating results to the patient, family, or caregiver   I have reviewed the above documentation for accuracy and completeness, and I agree with the above. Kirsten Poole, D.O.  The 21st Century Cures Act was signed into law in 2016 which includes the topic of electronic health records.  This provides immediate access to information in MyChart.  This includes consultation notes, operative notes, office notes, lab results and pathology reports.  If you have any questions about what you read please let us know at your next visit so we can discuss your concerns and take corrective action if need be.  We are right here with you.

## 2023-10-18 ENCOUNTER — Telehealth (INDEPENDENT_AMBULATORY_CARE_PROVIDER_SITE_OTHER): Payer: 59 | Admitting: Psychology

## 2023-10-18 DIAGNOSIS — F5089 Other specified eating disorder: Secondary | ICD-10-CM | POA: Diagnosis not present

## 2023-10-18 NOTE — Progress Notes (Signed)
  Office: [phone]  /  Fax: [phone]    Date: October 18, 2023  Appointment Start Time: 8:04am Duration: 25 minutes Provider: Wyatt Fire, Psy.D. Type of Session: Individual Therapy  Location of Patient: Parked in car at work (address obtained; private location) Location of Provider: Provider's Home (private office) Type of Contact: Telepsychological Visit via MyChart Video Visit  Session Content: This provider called Kirsten Poole at 8:03am as she did not present for today's appointment. Assistance was provided on connecting. As such, today's appointment was initiated 4 minutes late. Kirsten Poole is a 48 y.o. female presenting for a follow-up appointment to address the previously established treatment goal of increasing coping skills.Today's appointment was a telepsychological visit. Kirsten Poole provided verbal consent for today's telepsychological appointment and she is aware she is responsible for securing confidentiality on her end of the session. Prior to proceeding with today's appointment, Kirsten Poole's physical location at the time of this appointment was obtained as well a phone number she could be reached at in the event of technical difficulties. Kirsten Poole and this provider participated in today's telepsychological service.   This provider conducted a brief check-in. Kirsten Poole reflected on the last appointment and feeling really bad about the whole weight loss journey. Further explored and processed. Reviewed current eating habits. Discussed dieting mentality vs. lifestyle change as well as non-scale victories (e.g., increased strength; increased muscle mass; improved posture; increased time spent with husband walking). This provider and Kirsten Poole explored the story her mind tells her about her weight loss/eating habits that is stuck on repeat. For homework, she will focus on re-writing a more compassionate version. Overall, Kirsten Poole was receptive to today's appointment as evidenced by openness to sharing,  responsiveness to feedback, and willingness to work toward increasing self-compassion.  Mental Status Examination:  Appearance: neat Behavior: appropriate to circumstances Mood: sad Affect: mood congruent Speech: WNL Eye Contact: appropriate; tearful at times Psychomotor Activity: WNL Gait: unable to assess Thought Process: linear, logical, and goal directed and no evidence or endorsement of suicidal, homicidal, and self-harm ideation, plan and intent  Thought Content/Perception: no hallucinations, delusions, bizarre thinking or behavior endorsed or observed Orientation: AAOx4 Memory/Concentration: intact Insight: fair Judgment: fair  Interventions:  Conducted a brief chart review Provided empathic reflections and validation Reviewed content from the previous session Employed supportive psychotherapy interventions to facilitate reduced distress and to improve coping skills with identified stressors Engaged pt in a self-compassion exercise  DSM-5 Diagnosis(es): F50.89 Other Specified Feeding or Eating Disorder, Emotional Eating Behaviors  Treatment Goal & Progress: During the initial appointment with this provider, the following treatment goal was established: increase coping skills. Kirsten Poole has demonstrated progress in her goal as evidenced by increased awareness of hunger patterns and increased awareness of triggers for emotional eating behaviors. Kirsten Poole also continues to demonstrate willingness to engage in learned skill(s).  Plan: The next appointment is scheduled for 11/08/2023 at 8:30am, which will be via MyChart Video Visit. The next session will focus on working towards the established treatment goal and discussing homework.    Wyatt Fire, PsyD

## 2023-11-08 ENCOUNTER — Telehealth (INDEPENDENT_AMBULATORY_CARE_PROVIDER_SITE_OTHER): Payer: 59 | Admitting: Psychology

## 2023-11-08 DIAGNOSIS — F5089 Other specified eating disorder: Secondary | ICD-10-CM | POA: Diagnosis not present

## 2023-11-08 NOTE — Progress Notes (Signed)
  Office: [phone]  /  Fax: [phone]    Date: November 08, 2023  Appointment Start Time: 8:34am Duration: 28 minutes Provider: Lawerance Cruel, Psy.D. Type of Session: Individual Therapy  Location of Patient: Parked in car outside of work (private location) Location of Provider: Provider's Home (private office) Type of Contact: Telepsychological Visit via MyChart Video Visit  Session Content: Kirsten Poole is a 48 y.o. female presenting for a follow-up appointment to address the previously established treatment goal of increasing coping skills.Today's appointment was a telepsychological visit. Kirsten Poole provided verbal consent for today's telepsychological appointment and she is aware she is responsible for securing confidentiality on her end of the session. Prior to proceeding with today's appointment, Kirsten Poole's physical location at the time of this appointment was obtained as well a phone number she could be reached at in the event of technical difficulties. Kirsten Poole and this provider participated in today's telepsychological service.   This provider conducted a brief check-in. Kirsten Poole reflected on what was "holding" her back and making it challenging to make the "right choices" as it relates to eating. She noted praying and honoring physical hunger was helpful. Further explored and processed. It was reflected she was likely not eating enough and discussed the impact of the aforementioned on her goals with the clinic. Discussed dieting mentality vs. lifestyle change. Overall, Kirsten Poole was receptive to today's appointment as evidenced by openness to sharing and responsiveness to feedback.  Mental Status Examination:  Appearance: neat Behavior: appropriate to circumstances Mood: sad Affect: mood congruent and tearful at times Speech: WNL Eye Contact: appropriate Psychomotor Activity: WNL Gait: unable to assess Thought Process: linear, logical, and goal directed and no evidence or endorsement of suicidal,  homicidal, and self-harm ideation, plan and intent  Thought Content/Perception: no hallucinations, delusions, bizarre thinking or behavior endorsed or observed Orientation: AAOx4 Memory/Concentration: intact Insight: fair Judgment: fair  Interventions:  Conducted a brief chart review Provided empathic reflections and validation Reviewed content from the previous session Provided positive reinforcement Employed supportive psychotherapy interventions to facilitate reduced distress and to improve coping skills with identified stressors Employed motivational interviewing skills to assess patient's willingness/desire to adhere to recommended medical treatments and assignments  DSM-5 Diagnosis(es): F50.89 Other Specified Feeding or Eating Disorder, Emotional Eating Behaviors  Treatment Goal & Progress: During the initial appointment with this provider, the following treatment goal was established: increase coping skills. Kirsten Poole has demonstrated progress in her goal as evidenced by increased awareness of hunger patterns and increased awareness of triggers for emotional eating behaviors. Kirsten Poole also continues to demonstrate willingness to engage in learned skill(s).  Plan: The next appointment is scheduled for 12/05/2023 at 8:30am, which will be via MyChart Video Visit. The next session will focus on working towards the established treatment goal.   Lawerance Cruel, PsyD

## 2023-11-14 ENCOUNTER — Encounter (INDEPENDENT_AMBULATORY_CARE_PROVIDER_SITE_OTHER): Payer: Self-pay | Admitting: Internal Medicine

## 2023-11-14 ENCOUNTER — Ambulatory Visit (INDEPENDENT_AMBULATORY_CARE_PROVIDER_SITE_OTHER): Payer: 59 | Admitting: Internal Medicine

## 2023-11-14 VITALS — BP 136/80 | HR 76 | Temp 98.2°F | Ht 63.0 in | Wt 226.0 lb

## 2023-11-14 DIAGNOSIS — E559 Vitamin D deficiency, unspecified: Secondary | ICD-10-CM

## 2023-11-14 DIAGNOSIS — F5089 Other specified eating disorder: Secondary | ICD-10-CM | POA: Diagnosis not present

## 2023-11-14 DIAGNOSIS — Z6841 Body Mass Index (BMI) 40.0 and over, adult: Secondary | ICD-10-CM

## 2023-11-14 DIAGNOSIS — R7303 Prediabetes: Secondary | ICD-10-CM

## 2023-11-14 MED ORDER — VITAMIN D (ERGOCALCIFEROL) 1.25 MG (50000 UNIT) PO CAPS: 50000.0000 [IU] | ORAL_CAPSULE | ORAL | 0 refills | Status: DC

## 2023-11-14 NOTE — Assessment & Plan Note (Signed)
 Obesity with prediabetes. She has lost 5 pounds since the last visit, adhering to her nutrition and physical activity plan 50-60% of the time. She is eating more whole foods but lacks adequate protein, hydration, and meal regularity. Exercises twice a week for 20 minutes, mainly cardio and yoga. Stress eating noted. Discussed the importance of protein in weight management, including appetite suppression, metabolism boost, and muscle mass protection. Encouraged meal prepping to avoid decision fatigue and ensure protein intake. Discussed potential use of metformin and Wellbutrin for weight management and stress eating. Emphasized that medication is a responsible part of self-care. - Continue current nutrition and physical activity plan - Increase protein intake to 90 grams per day - Consider protein shakes as a creamer substitute - Explore [employer] website for healthy recipes and meal planning - Consider metformin or Wellbutrin for weight management and stress eating - Schedule follow-up with Dr.Opalsky

## 2023-11-14 NOTE — Assessment & Plan Note (Signed)
 Stable, on high-dose vitamin D supplementation.  Continue current regimen consider switching to over-the-counter supplementation.

## 2023-11-14 NOTE — Assessment & Plan Note (Signed)
 Most recent A1c is  Lab Results  Component Value Date   HGBA1C 5.7 (H) 12/31/2022   HGBA1C 5.6 12/23/2015    Patient aware of disease state and risk of progression. This may contribute to abnormal cravings, fatigue and diabetic complications without having diabetes.   We have discussed treatment options which include: losing 7 to 10% of body weight, increasing physical activity to a goal of 150 minutes a week at moderate intensity.  Advised to maintain a diet low on simple and processed carbohydrates.  She may also be a candidate for pharmacoprophylaxis with metformin or incretin mimetic.

## 2023-11-14 NOTE — Progress Notes (Signed)
 Office: [phone]  /  Fax: [phone]  Weight Summary And Biometrics  Vitals Temp: 98.2 F (36.8 C) BP: 136/80 Pulse Rate: 76 SpO2: 97 %   Anthropometric Measurements Height: 5\' 3"  (1.6 m) Weight: 226 lb (102.5 kg) BMI (Calculated): 40.04 Weight at Last Visit: 231 lb Weight Lost Since Last Visit: 5 lb Weight Gained Since Last Visit: 0 lb Starting Weight: 232 lb Total Weight Loss (lbs): 6 lb (2.722 kg) Peak Weight: 240 lb   Body Composition  Body Fat %: 48.6 % Fat Mass (lbs): 110 lbs Muscle Mass (lbs): 110.6 lbs Total Body Water (lbs): 86.4 lbs Visceral Fat Rating : 14    No data recorded Today's Visit #: 10  Starting Date: 02/23/23   Subjective   Chief Complaint: Obesity  Interval History Discussed the use of AI scribe software for clinical note transcription with the patient, who gave verbal consent to proceed.  History of Present Illness   Kirsten Poole is a 48 year old female with obesity and prediabetes who presents for weight management follow-up.  She is a patient of Dr. Sharee Holster.  She has lost five pounds since her last visit and is not currently on any anti-obesity medications. She adheres to her dietary plan 50-60% of the time, focusing on consuming more whole foods. Her dietary habits include insufficient protein intake, inadequate hydration, and skipping meals. She has adjusted her eating schedule to start later in the day, around 10 or 11 AM, sometimes having an early lunch and dinner. She occasionally snacks with her family in the evening, choosing healthier options like yogurt or an apple.  Her physical activity regimen includes exercising two days a week for about 20 minutes, primarily cardio, and practicing yoga. She incorporates stress-relieving activities like doing jumping jacks or weightlifting when feeling the urge to stress eat. She walks around her neighborhood once or twice a week.  She sleeps 7-9 hours per night but experiences  stress, which she identifies as a trigger for her eating habits. She has been working on recognizing and managing stress-related eating with the help of Dr. Dewaine Conger.  Her current dietary intake is approximately 1200-1300 calories per day with a target of 90 grams of protein, which she acknowledges she does not always meet. She has started using protein shakes to increase her protein intake, although she is not fond of their taste. She has noticed an increased craving for bread this past month, which she attributes to stress and possibly high insulin levels. She is tracking her calorie intake and is aware of the nutritional content of her meals.         Challenges affecting patient progress:  Emotional eating tendencies .    Pharmacotherapy for weight management: She is currently taking no anti-obesity medication.   Assessment and Plan   Treatment Plan For Obesity:  Recommended Dietary Goals  Kirsten Poole is currently in the action stage of change. As such, her goal is to continue weight management plan. She has agreed to: continue current plan  Behavioral Health and Counseling  We discussed the following behavioral modification strategies today: increasing lean protein intake to established goals, practice mindfulness eating and understand the difference between hunger signals and cravings, work on managing stress, creating time for self-care and relaxation, and continue to follow-up with Dr. Dewaine Conger .  Additional education and resources provided today: Provided with personal guidance and instructions on how to use [employer] for healthy meal ideas and cooking in bulk.  Recommended Physical Activity Goals  Kirsten Poole has been advised to work up to 150 minutes of moderate intensity aerobic activity a week and strengthening exercises 2-3 times per week for cardiovascular health, weight loss maintenance and preservation of muscle mass.   She has agreed to :  Think about enjoyable ways to increase  daily physical activity and overcoming barriers to exercise and Increase physical activity in their day and reduce sedentary time (increase NEAT).  Pharmacotherapy  We discussed various medication options to help Kirsten Poole with her weight loss efforts and we both agreed to :  Patient would like to hold off pharmacotherapy for the time being she was provided with a handout containing approved antiobesity medications.  Associated Conditions Impacted by Obesity Treatment  Other disorder of eating Assessment & Plan: Patient has emotional eating tendencies, she has been working with cognitive behavioral therapy and Dr. Dewaine Conger and is making some progress.  This may be also neurohormonal send may be of benefit considering pharmacotherapy.   Vitamin D deficiency Assessment & Plan: Stable, on high-dose vitamin D supplementation.  Continue current regimen consider switching to over-the-counter supplementation.  Orders: -     Vitamin D (Ergocalciferol); Take 1 capsule (50,000 Units total) by mouth every 7 (seven) days.  Dispense: 4 capsule; Refill: 0  Prediabetes Assessment & Plan: Most recent A1c is  Lab Results  Component Value Date   HGBA1C 5.7 (H) 12/31/2022   HGBA1C 5.6 12/23/2015    Patient aware of disease state and risk of progression. This may contribute to abnormal cravings, fatigue and diabetic complications without having diabetes.   We have discussed treatment options which include: losing 7 to 10% of body weight, increasing physical activity to a goal of 150 minutes a week at moderate intensity.  Advised to maintain a diet low on simple and processed carbohydrates.  She may also be a candidate for pharmacoprophylaxis with metformin or incretin mimetic.     Morbid obesity with BMI of 40.0-44.9, adult [facility] [place]) Assessment & Plan: Obesity with prediabetes. She has lost 5 pounds since the last visit, adhering to her nutrition and physical activity plan 50-60% of the time. She is  eating more whole foods but lacks adequate protein, hydration, and meal regularity. Exercises twice a week for 20 minutes, mainly cardio and yoga. Stress eating noted. Discussed the importance of protein in weight management, including appetite suppression, metabolism boost, and muscle mass protection. Encouraged meal prepping to avoid decision fatigue and ensure protein intake. Discussed potential use of metformin and Wellbutrin for weight management and stress eating. Emphasized that medication is a responsible part of self-care. - Continue current nutrition and physical activity plan - Increase protein intake to 90 grams per day - Consider protein shakes as a creamer substitute - Explore [employer] website for healthy recipes and meal planning - Consider metformin or Wellbutrin for weight management and stress eating - Schedule follow-up with Dr.Opalsky    Assessment and Plan           Objective   Physical Exam:  Blood pressure 136/80, pulse 76, temperature 98.2 F (36.8 C), height 5\' 3"  (1.6 m), weight 226 lb (102.5 kg), SpO2 97%. Body mass index is 40.03 kg/m.  General: She is overweight, cooperative, alert, well developed, and in no acute distress. PSYCH: Has normal mood, affect and thought process.   HEENT: EOMI, sclerae are anicteric. Lungs: Normal breathing effort, no conversational dyspnea. Extremities: No edema.  Neurologic: No gross sensory or motor deficits. No tremors or fasciculations noted.    Diagnostic Data  Reviewed:  BMET    Component Value Date/Time   NA 141 12/31/2022 1550   NA 138 12/23/2015 1146   K 4.3 12/31/2022 1550   CL 104 12/31/2022 1550   CO2 26 12/31/2022 1550   GLUCOSE 81 12/31/2022 1550   BUN 17 12/31/2022 1550   BUN 14 12/23/2015 1146   CREATININE 1.14 (H) 12/31/2022 1550   CALCIUM 9.5 12/31/2022 1550   GFRNONAA >60 10/19/2020 1140   GFRAA 112 12/23/2015 1146   Lab Results  Component Value Date   HGBA1C 5.7 (H) 12/31/2022    HGBA1C 5.6 12/23/2015   Lab Results  Component Value Date   INSULIN 28.1 (H) 02/23/2023   Lab Results  Component Value Date   TSH 1.57 12/31/2022   CBC    Component Value Date/Time   WBC 8.7 12/31/2022 1550   RBC 4.82 12/31/2022 1550   HGB 13.5 12/31/2022 1550   HCT 40.5 12/31/2022 1550   PLT 401 (H) 12/31/2022 1550   MCV 84.0 12/31/2022 1550   MCH 28.0 12/31/2022 1550   MCHC 33.3 12/31/2022 1550   RDW 13.3 12/31/2022 1550   Iron Studies No results found for: "IRON", "TIBC", "FERRITIN", "IRONPCTSAT" Lipid Panel     Component Value Date/Time   CHOL 168 12/31/2022 1550   CHOL 149 12/23/2015 1146   TRIG 202 (H) 12/31/2022 1550   HDL 43 (L) 12/31/2022 1550   HDL 43 12/23/2015 1146   CHOLHDL 3.9 12/31/2022 1550   LDLCALC 96 12/31/2022 1550   Hepatic Function Panel     Component Value Date/Time   PROT 6.6 12/31/2022 1550   PROT 6.7 12/23/2015 1146   ALBUMIN 3.8 10/19/2020 1140   ALBUMIN 4.1 12/23/2015 1146   AST 16 12/31/2022 1550   ALT 16 12/31/2022 1550   ALKPHOS 59 10/19/2020 1140   BILITOT 0.3 12/31/2022 1550   BILITOT 0.3 12/23/2015 1146      Component Value Date/Time   TSH 1.57 12/31/2022 1550   Nutritional Lab Results  Component Value Date   VD25OH 17.9 (L) 02/23/2023    Medications: Outpatient Encounter Medications as of 11/14/2023  Medication Sig   [DISCONTINUED] Vitamin D, Ergocalciferol, (DRISDOL) 1.25 MG (50000 UNIT) CAPS capsule Take 1 capsule (50,000 Units total) by mouth every 7 (seven) days.   omeprazole (PRILOSEC) 20 MG capsule 20 mg daily.   Vitamin D, Ergocalciferol, (DRISDOL) 1.25 MG (50000 UNIT) CAPS capsule Take 1 capsule (50,000 Units total) by mouth every 7 (seven) days.   No facility-administered encounter medications on file as of 11/14/2023.     Follow-Up   No follow-ups on file.Marland Kitchen She was informed of the importance of frequent follow up visits to maximize her success with intensive lifestyle modifications for her multiple health  conditions.  Attestation Statement   Reviewed by clinician on day of visit: allergies, medications, problem list, medical history, surgical history, family history, social history, and previous encounter notes.   I have spent 40 minutes in the care of the patient today including: preparing to see patient (e.g. review and interpretation of tests, old notes ), obtaining and/or reviewing separately obtained history, counseling and educating the patient, documenting clinical information in the electronic or other health care record, and independently interpreting results and communicating results to the patient, family, or caregiver   Worthy Rancher, MD

## 2023-11-14 NOTE — Assessment & Plan Note (Signed)
 Patient has emotional eating tendencies, she has been working with cognitive behavioral therapy and Dr. Dewaine Conger and is making some progress.  This may be also neurohormonal send may be of benefit considering pharmacotherapy.

## 2023-12-05 ENCOUNTER — Telehealth (INDEPENDENT_AMBULATORY_CARE_PROVIDER_SITE_OTHER): Payer: 59 | Admitting: Psychology

## 2023-12-05 ENCOUNTER — Telehealth (INDEPENDENT_AMBULATORY_CARE_PROVIDER_SITE_OTHER): Payer: Self-pay | Admitting: Psychology

## 2023-12-05 NOTE — Progress Notes (Unsigned)
  Office: [phone]  /  Fax: [phone]    Date: December 05, 2023  Appointment Start Time: *** Duration: *** minutes Provider: Lawerance Cruel, Psy.D. Type of Session: Individual Therapy  Location of Patient: {gbptloc:23249} (private location) Location of Provider: Provider's Home (private office) Type of Contact: Telepsychological Visit via MyChart Video Visit  Session Content: Kirsten Poole is a 48 y.o. female presenting for a follow-up appointment to address the previously established treatment goal of increasing coping skills.Today's appointment was a telepsychological visit. Kirsten Poole provided verbal consent for today's telepsychological appointment and she is aware she is responsible for securing confidentiality on her end of the session. Prior to proceeding with today's appointment, Karrisa's physical location at the time of this appointment was obtained as well a phone number she could be reached at in the event of technical difficulties. Kirsten Poole and this provider participated in today's telepsychological service.   This provider conducted a brief check-in. *** Kirsten Poole was receptive to today's appointment as evidenced by openness to sharing, responsiveness to feedback, and {gbreceptiveness:23401}.  Mental Status Examination:  Appearance: {Appearance:22431} Behavior: {Behavior:22445} Mood: {gbmood:21757} Affect: {Affect:22436} Speech: {Speech:22432} Eye Contact: {Eye Contact:22433} Psychomotor Activity: {Motor Activity:22434} Gait: {gbgait:23404} Thought Process: {thought process:22448}  Thought Content/Perception: {disturbances:22451} Orientation: {Orientation:22437} Memory/Concentration: {gbcognition:22449} Insight: {Insight:22446} Judgment: {Insight:22446}  Interventions:  {Interventions for Progress Notes:23405}  DSM-5 Diagnosis(es): F50.89 Other Specified Feeding or Eating Disorder, Emotional Eating Behaviors  Treatment Goal & Progress: During the initial appointment with this  provider, the following treatment goal was established: increase coping skills. Kirsten Poole has demonstrated progress in her goal as evidenced by {gbtxprogress:22839}. Kirsten Poole also {gbtxprogress2:22951}.  Plan: The next appointment is scheduled for *** at ***, which will be via MyChart Video Visit. The next session will focus on {Plan for Next Appointment:23400}.   Lawerance Cruel, PsyD

## 2023-12-05 NOTE — Telephone Encounter (Signed)
  Office: [phone]  /  Fax: [phone]  Date of Call: December 05, 2023  Time of Call: 8:34am Provider: Lawerance Cruel, PsyD  CONTENT: This provider called [name] to check-in as she did not present for today's MyChart Video Visit appointment. A HIPAA compliant voicemail was left requesting a call back. Of note, this provider stayed on the MyChart Video Visit appointment for 5 minutes prior to signing off per the clinic's grace period policy.    PLAN: This provider will wait for Haliegh to call back. No further follow-up planned by this provider.

## 2023-12-12 ENCOUNTER — Ambulatory Visit (INDEPENDENT_AMBULATORY_CARE_PROVIDER_SITE_OTHER): Payer: 59 | Admitting: Family Medicine

## 2023-12-12 ENCOUNTER — Encounter (INDEPENDENT_AMBULATORY_CARE_PROVIDER_SITE_OTHER): Payer: Self-pay | Admitting: Family Medicine

## 2023-12-12 VITALS — BP 118/84 | HR 80 | Temp 98.1°F | Ht 63.0 in | Wt 226.0 lb

## 2023-12-12 DIAGNOSIS — R7303 Prediabetes: Secondary | ICD-10-CM

## 2023-12-12 DIAGNOSIS — F5089 Other specified eating disorder: Secondary | ICD-10-CM | POA: Diagnosis not present

## 2023-12-12 DIAGNOSIS — E781 Pure hyperglyceridemia: Secondary | ICD-10-CM | POA: Diagnosis not present

## 2023-12-12 DIAGNOSIS — E559 Vitamin D deficiency, unspecified: Secondary | ICD-10-CM | POA: Diagnosis not present

## 2023-12-12 DIAGNOSIS — Z6841 Body Mass Index (BMI) 40.0 and over, adult: Secondary | ICD-10-CM

## 2023-12-12 MED ORDER — BUPROPION HCL ER (SR) 100 MG PO TB12
100.0000 mg | ORAL_TABLET | Freq: Two times a day (BID) | ORAL | 0 refills | Status: DC
Start: 2023-12-12 — End: 2024-01-30

## 2023-12-12 MED ORDER — VITAMIN D (ERGOCALCIFEROL) 1.25 MG (50000 UNIT) PO CAPS: 50000.0000 [IU] | ORAL_CAPSULE | ORAL | 1 refills | Status: DC

## 2023-12-12 NOTE — Progress Notes (Signed)
 Kirsten Poole, D.O.  ABFM, ABOM Specializing in Clinical Bariatric Medicine  Office located at: [address] [place], [place]  [address]   Assessment and Plan:   Will put in orders for labs; pt will come in 3-4 days prior to next OV to obtain them.  FOR THE DISEASE OF OBESITY:  BMI 40.0-44.9, adult (HCC)- current BMI 40.04 Morbid obesity with BMI of 40.0-44.9, adult [name]) Assessment & Plan: Since last office visit on 11/14/2023 patient's  Muscle mass has decreased by 0.8 lb. Fat mass has increased by 0.6 lb. Total body water has increased by 2.2 lb.  Counseling done on how various foods will affect these numbers and how to maximize success  Total lbs lost to date: 6 lbs  Total weight loss percentage to date: 2.56%    Recommended Dietary Goals [name] is currently in the action stage of change. As such, her goal is to continue weight management plan.  She has agreed to: continue current plan   Behavioral Intervention We discussed the following today: avoiding skipping meals, work on managing stress, creating time for self-care and relaxation, and continue to practice mindfulness when eating  Additional resources provided today: None  Evidence-based interventions for health behavior change were utilized today including the discussion of self monitoring techniques, problem-solving barriers and SMART goal setting techniques.   Regarding patient's less desirable eating habits and patterns, we employed the technique of small changes.   Pt will specifically work on: n/a   Recommended Physical Activity Goals [name] has been advised to work up to 150 minutes of moderate intensity aerobic activity a week and strengthening exercises 2-3 times per week for cardiovascular health, weight loss maintenance and preservation of muscle mass.   She has agreed to : continue to gradually increase the amount and intensity of exercise routine   Pharmacotherapy We both agreed to : start  Wellbutrin    FOR ASSOCIATED CONDITIONS ADDRESSED TODAY:  Other disorder of eating - emotional eating Assessment & Plan: Pt has persistent carb cravings and has emotional eating tendencies (tends to stress eat). She has been working with cognitive behavioral therapy and Dr. Dewaine Conger. Had long discussion of anticipated benefits and potential risks of Wellbutrin for management of emotional eating, cravings, and weight. She denies h/o seizure disorder. Pt is agreeable to STARTING Wellbutrin - pt understands to start with one tablet in the morning for 6-7 days and then increase to one tablet twice daily if tolerating well. In addition to Dr.Barker, I also highly encourage pt to obtain a general counselor to help cultivate self-confidence and a positive self-image.  Relevant Orders:  -     buPROPion HCl ER (SR); Take 1 tablet (100 mg total) by mouth 2 (two) times daily.  Dispense: 60 tablet; Refill: 0   Prediabetes Assessment & Plan: Lab Results  Component Value Date   HGBA1C 5.7 (H) 12/31/2022   Diet/lifestyle controlled.Continue with reduced calorie meal plan low on processed crabs and simple sugars. Will put in orders for labs and discuss results next OV.   Relevant Orders:  -     Vitamin B12 -     TSH -     T4, free -     Hemoglobin A1c -     Insulin, random -     Comprehensive metabolic panel with GFR -     CBC with Differential/Platelet   Hypertriglyceridemia Assessment & Plan: Lab Results  Component Value Date   CHOL 168 12/31/2022   HDL  43 (L) 12/31/2022   LDLCALC 96 12/31/2022   TRIG 202 (H) 12/31/2022   CHOLHDL 3.9 12/31/2022    Diet/lifestyle controlled. Last lipid panel obtained 11 mos ago. Continue to work on nutrition plan -decreasing simple carbohydrates, increasing lean proteins, decreasing saturated fats and cholesterol , avoiding trans fats and exercise as able to promote weight loss, improve lipids and decrease cardiovascular risks.  Will put in orders for labs  and discuss results next OV.   Relevant Orders:  -     Lipid panel   Vitamin D deficiency Assessment & Plan: Currently on ergocalciferol 50,000 units every 7 days without any adverse effects such as nausea, vomiting or muscle weakness. Continue regimen. Recheck periodically.  Relevant Orders:  -     Vitamin D (Ergocalciferol); Take 1 capsule (50,000 Units total) by mouth every 7 (seven) days.  Dispense: 4 capsule; Refill: 1 -     VITAMIN D 25 Hydroxy (Vit-D Deficiency, Fractures)   Follow up:   Return 01/09/2024. She was informed of the importance of frequent follow up visits to maximize her success with intensive lifestyle modifications for her multiple health conditions.  [name] is aware that we will review all of her lab results at our next visit.  She is aware that if anything is critical/ life threatening with the results, we will be contacting her via MyChart prior to the office visit to discuss management.    Subjective:   Chief complaint: Obesity Kirsten Poole is here to discuss her progress with her obesity treatment plan. She is  keeping a food journal and adhering to recommended goals of 1200-1300 calories and 90+ g of  protein with the Cat 2 meal plan as a guide and states she is following her eating plan approximately 70% of the time. She states she is walking 15-20 minutes 2 days per week.  Interval History:  Kirsten Poole is here for a follow up office visit. Since last OV on 11/14/2023 with Dr.Maldonado, pt's weight has not changed. Her dietary habits include skipping meals (she typically skips breakfast). Patient has emotional eating tendencies (tends to stress eat). She has been working with cognitive behavioral therapy and Dr. Dewaine Conger.   Pharmacotherapy for weight loss: She is currently taking no anti-obesity medication.   Review of Systems:  Pertinent positives were addressed with patient today.  Reviewed by clinician on day of visit: allergies, medications,  problem list, medical history, surgical history, family history, social history, and previous encounter notes.  Weight Summary and Biometrics   Weight Lost Since Last Visit: 0  Weight Gained Since Last Visit: 0   Vitals Temp: 98.1 F (36.7 C) BP: 118/84 Pulse Rate: 80 SpO2: 96 %   Anthropometric Measurements Height: 5\' 3"  (1.6 m) Weight: 226 lb (102.5 kg) BMI (Calculated): 40.04 Weight at Last Visit: 226lb Weight Lost Since Last Visit: 0 Weight Gained Since Last Visit: 0 Starting Weight: 232lb Total Weight Loss (lbs): 6 lb (2.722 kg) Peak Weight: 240lb   Body Composition  Body Fat %: 48.9 % Fat Mass (lbs): 110.6 lbs Muscle Mass (lbs): 109.8 lbs Total Body Water (lbs): 88.6 lbs Visceral Fat Rating : 14   Other Clinical Data Fasting: no Labs: n Today's Visit #: 11 Starting Date: 02/23/23   Objective:   PHYSICAL EXAM: Blood pressure 118/84, pulse 80, temperature 98.1 F (36.7 C), height 5\' 3"  (1.6 m), weight 226 lb (102.5 kg), SpO2 96%. Body mass index is 40.03 kg/m.  General: she is overweight, cooperative and  in no acute distress. PSYCH: Has normal mood, affect and thought process.   HEENT: EOMI, sclerae are anicteric. Lungs: Normal breathing effort, no conversational dyspnea. Extremities: Moves * 4 Neurologic: A and O * 3, good insight  DIAGNOSTIC DATA REVIEWED: BMET    Component Value Date/Time   NA 141 12/31/2022 1550   NA 138 12/23/2015 1146   K 4.3 12/31/2022 1550   CL 104 12/31/2022 1550   CO2 26 12/31/2022 1550   GLUCOSE 81 12/31/2022 1550   BUN 17 12/31/2022 1550   BUN 14 12/23/2015 1146   CREATININE 1.14 (H) 12/31/2022 1550   CALCIUM 9.5 12/31/2022 1550   GFRNONAA >60 10/19/2020 1140   GFRAA 112 12/23/2015 1146   Lab Results  Component Value Date   HGBA1C 5.7 (H) 12/31/2022   HGBA1C 5.6 12/23/2015   Lab Results  Component Value Date   INSULIN 28.1 (H) 02/23/2023   Lab Results  Component Value Date   TSH 1.57 12/31/2022    CBC    Component Value Date/Time   WBC 8.7 12/31/2022 1550   RBC 4.82 12/31/2022 1550   HGB 13.5 12/31/2022 1550   HCT 40.5 12/31/2022 1550   PLT 401 (H) 12/31/2022 1550   MCV 84.0 12/31/2022 1550   MCH 28.0 12/31/2022 1550   MCHC 33.3 12/31/2022 1550   RDW 13.3 12/31/2022 1550   Iron Studies No results found for: "IRON", "TIBC", "FERRITIN", "IRONPCTSAT" Lipid Panel     Component Value Date/Time   CHOL 168 12/31/2022 1550   CHOL 149 12/23/2015 1146   TRIG 202 (H) 12/31/2022 1550   HDL 43 (L) 12/31/2022 1550   HDL 43 12/23/2015 1146   CHOLHDL 3.9 12/31/2022 1550   LDLCALC 96 12/31/2022 1550   Hepatic Function Panel     Component Value Date/Time   PROT 6.6 12/31/2022 1550   PROT 6.7 12/23/2015 1146   ALBUMIN 3.8 10/19/2020 1140   ALBUMIN 4.1 12/23/2015 1146   AST 16 12/31/2022 1550   ALT 16 12/31/2022 1550   ALKPHOS 59 10/19/2020 1140   BILITOT 0.3 12/31/2022 1550   BILITOT 0.3 12/23/2015 1146      Component Value Date/Time   TSH 1.57 12/31/2022 1550   Nutritional Lab Results  Component Value Date   VD25OH 17.9 (L) 02/23/2023    Attestations:   I, Special Puri, acting as a Stage manager for Thomasene Lot, DO., have compiled all relevant documentation for today's office visit on behalf of Thomasene Lot, DO, while in the presence of [employer], DO.  Reviewed by clinician on day of visit: allergies, medications, problem list, medical history, surgical history, family history, social history, and previous encounter notes pertinent to patient's obesity diagnosis. I have spent 52 minutes in the care of the patient today including: preparing to see patient (e.g. review and interpretation of tests, old notes ), obtaining and/or reviewing separately obtained history, performing a medically appropriate examination or evaluation, counseling and educating the patient, ordering medications, test or procedures, documenting clinical information in the electronic or  other health care record, and independently interpreting results and communicating results to the patient, family, or caregiver   I have reviewed the above documentation for accuracy and completeness, and I agree with the above. Kirsten Poole, D.O.  The 21st Century Cures Act was signed into law in 2016 which includes the topic of electronic health records.  This provides immediate access to information in MyChart.  This includes consultation notes, operative notes, office notes, lab results and  pathology reports.  If you have any questions about what you read please let us know at your next visit so we can discuss your concerns and take corrective action if need be.  We are right here with you.

## 2024-01-09 ENCOUNTER — Encounter (INDEPENDENT_AMBULATORY_CARE_PROVIDER_SITE_OTHER): Payer: Self-pay

## 2024-01-09 ENCOUNTER — Ambulatory Visit (INDEPENDENT_AMBULATORY_CARE_PROVIDER_SITE_OTHER): Admitting: Family Medicine

## 2024-01-30 ENCOUNTER — Ambulatory Visit (INDEPENDENT_AMBULATORY_CARE_PROVIDER_SITE_OTHER): Admitting: Family Medicine

## 2024-01-30 ENCOUNTER — Encounter (INDEPENDENT_AMBULATORY_CARE_PROVIDER_SITE_OTHER): Payer: Self-pay | Admitting: Family Medicine

## 2024-01-30 VITALS — BP 123/85 | HR 71 | Temp 98.1°F | Ht 63.0 in | Wt 223.0 lb

## 2024-01-30 DIAGNOSIS — Z6839 Body mass index (BMI) 39.0-39.9, adult: Secondary | ICD-10-CM | POA: Diagnosis not present

## 2024-01-30 DIAGNOSIS — F5089 Other specified eating disorder: Secondary | ICD-10-CM

## 2024-01-30 DIAGNOSIS — E559 Vitamin D deficiency, unspecified: Secondary | ICD-10-CM | POA: Diagnosis not present

## 2024-01-30 DIAGNOSIS — Z6841 Body Mass Index (BMI) 40.0 and over, adult: Secondary | ICD-10-CM

## 2024-01-30 MED ORDER — VITAMIN D (ERGOCALCIFEROL) 1.25 MG (50000 UNIT) PO CAPS: 50000.0000 [IU] | ORAL_CAPSULE | ORAL | 0 refills | Status: DC

## 2024-01-30 NOTE — Progress Notes (Signed)
 Kirsten Poole, D.O.  ABFM, ABOM Specializing in Clinical Bariatric Medicine  Office located at: [address] [place], [place]  [address]      Assessment and Plan:   Medications Discontinued During This Encounter  Medication Reason   buPROPion  ER (WELLBUTRIN  SR) 100 MG 12 hr tablet Patient Preference   Vitamin D , Ergocalciferol , (DRISDOL ) 1.25 MG (50000 UNIT) CAPS capsule Reorder     Meds ordered this encounter  Medications   Vitamin D , Ergocalciferol , (DRISDOL ) 1.25 MG (50000 UNIT) CAPS capsule    Sig: Take 1 capsule (50,000 Units total) by mouth every 7 (seven) days.    Dispense:  12 capsule    Refill:  0    Pt will obtain 9 different labs I ordered on 12/12/2023- she prefers to come into clinic 3-4 days prior to next OV for lab draw.  We can discuss results at next OV.     FOR THE DISEASE OF OBESITY:  BMI 40.0-44.9, adult (HCC)- current BMI 39.51 Morbid obesity with BMI of 40.0-44.9, adult [facility]) Assessment & Plan: Since last office visit on 12/12/23 patient's  Muscle mass has decreased by 2 lb. Fat mass has decreased by 0.6 lb. Total body water not registered on scale. Counseling done on how various foods will affect these numbers and how to maximize success  Total lbs lost to date: 9 lbs  Total weight loss percentage to date: 3.88%    Recommended Dietary Goals Kirsten Poole is currently in the action stage of change. As such, her goal is to continue weight management plan.  She has agreed to: continue current plan   Behavioral Intervention We discussed the following today: increasing fiber rich foods, increasing water intake , and reading food labels   Additional resources provided today: Handout on Common Characteristics of Successful Weight Losers,  Evidence-based interventions for health behavior change were utilized today including the discussion of self monitoring techniques, problem-solving barriers and SMART goal setting techniques.   Regarding  patient's less desirable eating habits and patterns, we employed the technique of small changes.   Pt will specifically work on decreasing her intake of diet soda to once or twice daily and drinking at least 60 ounces of water daily.    Recommended Physical Activity Goals Kirsten Poole has been advised to work up to 300-450 minutes of moderate intensity aerobic activity a week and strengthening exercises 2-3 times per week for cardiovascular health, weight loss maintenance and preservation of muscle mass.    Pharmacotherapy We both agreed to : continue with nutritional and behavioral strategies    ASSOCIATED CONDITIONS ADDRESSED TODAY:  Other disorder of eating- emotional eating Assessment & Plan: Never started the Wellbutrin  I wrote for LOV. Feels that her emotional eating is well controlled; she is practicing mindfulness and is implementing Kirsten Poole's strategies. Doing very well with her EE behaviors.  Will D/C Wellbutrin ; pt prefers not to take. Continue mindful eating strategies.  Continue counseling per Kirsten Poole as desired.  Reminded pt to eat on a regular basis (every 3-5 hrs). Continue prudent nutritional plan.  Exercise as a form of stress mgt   Vitamin D  deficiency Assessment & Plan: Most recent VD: Lab Results  Component Value Date   VD25OH 17.9 (L) 02/23/2023   Is on ergocalciferol  50,000 units every 7 days without any adverse SE.   Vit D not at goal.  Continue regimen and RF.  Pt agrees to recheck levels near future as per above and understands importance of monitoring our therapy.  Follow up:   Return 03/06/2024 at 4:00 PM with Kirsten Poole. She was informed of the importance of frequent follow up visits to maximize her success with intensive lifestyle modifications for her multiple health conditions.   Subjective:   Chief complaint: Obesity Kirsten Poole is here to discuss her progress with her obesity treatment plan. She is  keeping a food journal and adhering to  recommended goals of 1200-1300 calories and 90+ g of  protein with the Cat 2 meal plan as a guide and states she is following her eating plan approximately 75% of the time. She states she is walking 45 minutes 2-3 days per week.  Interval History:  Kirsten Poole is here for a follow up office visit. Since last OV on 11/24/23, Kirsten Poole is down 3 lbs. Denies skipping meals. For the most part, she is adhering to her journaling parameters. Is exploring new recipes on skinny taste- which is great. Her hunger and cravings are controlled.  No c/o emotional eating today and doesn't wish to be on Wellbutrin .   Pharmacotherapy for weight loss: none   Review of Systems:  Pertinent positives were addressed with patient today.  Reviewed by clinician on day of visit: allergies, medications, problem list, medical history, surgical history, family history, social history, and previous encounter notes.  Weight Summary and Biometrics   Weight Lost Since Last Visit: 3lb  Weight Gained Since Last Visit: 0   Vitals Temp: 98.1 F (36.7 C) BP: 123/85 Pulse Rate: 71 SpO2: 98 %   Anthropometric Measurements Height: 5\' 3"  (1.6 m) Weight: 223 lb (101.2 kg) BMI (Calculated): 39.51 Weight at Last Visit: 226lb Weight Lost Since Last Visit: 3lb Weight Gained Since Last Visit: 0 Starting Weight: 232lb Total Weight Loss (lbs): 9 lb (4.082 kg) Peak Weight: 240lb   Body Composition  Body Fat %: 49.2 % Fat Mass (lbs): 110 lbs Muscle Mass (lbs): 107.8 lbs Visceral Fat Rating : 14   Other Clinical Data Fasting: no Labs: no Today's Visit #: 12 Starting Date: 02/23/23   Objective:   PHYSICAL EXAM: Blood pressure 123/85, pulse 71, temperature 98.1 F (36.7 C), height 5\' 3"  (1.6 m), weight 223 lb (101.2 kg), SpO2 98%. Body mass index is 39.5 kg/m.  General: she is overweight, cooperative and in no acute distress. PSYCH: Has normal mood, affect and thought process.   HEENT: EOMI, sclerae  are anicteric. Lungs: Normal breathing effort, no conversational dyspnea. Extremities: Moves * 4 Neurologic: A and O * 3, good insight  DIAGNOSTIC DATA REVIEWED: BMET    Component Value Date/Time   NA 141 12/31/2022 1550   NA 138 12/23/2015 1146   K 4.3 12/31/2022 1550   CL 104 12/31/2022 1550   CO2 26 12/31/2022 1550   GLUCOSE 81 12/31/2022 1550   BUN 17 12/31/2022 1550   BUN 14 12/23/2015 1146   CREATININE 1.14 (H) 12/31/2022 1550   CALCIUM 9.5 12/31/2022 1550   GFRNONAA >60 10/19/2020 1140   GFRAA 112 12/23/2015 1146   Lab Results  Component Value Date   HGBA1C 5.7 (H) 12/31/2022   HGBA1C 5.6 12/23/2015   Lab Results  Component Value Date   INSULIN  28.1 (H) 02/23/2023   Lab Results  Component Value Date   TSH 1.57 12/31/2022   CBC    Component Value Date/Time   WBC 8.7 12/31/2022 1550   RBC 4.82 12/31/2022 1550   HGB 13.5 12/31/2022 1550   HCT 40.5 12/31/2022 1550   PLT 401 (H) 12/31/2022  1550   MCV 84.0 12/31/2022 1550   MCH 28.0 12/31/2022 1550   MCHC 33.3 12/31/2022 1550   RDW 13.3 12/31/2022 1550   Iron Studies No results found for: "IRON", "TIBC", "FERRITIN", "IRONPCTSAT" Lipid Panel     Component Value Date/Time   CHOL 168 12/31/2022 1550   CHOL 149 12/23/2015 1146   TRIG 202 (H) 12/31/2022 1550   HDL 43 (L) 12/31/2022 1550   HDL 43 12/23/2015 1146   CHOLHDL 3.9 12/31/2022 1550   LDLCALC 96 12/31/2022 1550   Hepatic Function Panel     Component Value Date/Time   PROT 6.6 12/31/2022 1550   PROT 6.7 12/23/2015 1146   ALBUMIN 3.8 10/19/2020 1140   ALBUMIN 4.1 12/23/2015 1146   AST 16 12/31/2022 1550   ALT 16 12/31/2022 1550   ALKPHOS 59 10/19/2020 1140   BILITOT 0.3 12/31/2022 1550   BILITOT 0.3 12/23/2015 1146      Component Value Date/Time   TSH 1.57 12/31/2022 1550   Nutritional Lab Results  Component Value Date   VD25OH 17.9 (L) 02/23/2023    Attestations:   I, Special Puri, acting as a Stage manager for Marceil Sensor,  Poole., have compiled all relevant documentation for today's office visit on behalf of Marceil Sensor, Poole, while in the presence of [employer], Poole.  I have reviewed the above documentation for accuracy and completeness, and I agree with the above. Kirsten Poole, D.O.  The 21st Century Cures Act was signed into law in 2016 which includes the topic of electronic health records.  This provides immediate access to information in MyChart.  This includes consultation notes, operative notes, office notes, lab results and pathology reports.  If you have any questions about what you read please let us  know at your next visit so we can discuss your concerns and take corrective action if need be.  We are right here with you.

## 2024-02-20 ENCOUNTER — Encounter (INDEPENDENT_AMBULATORY_CARE_PROVIDER_SITE_OTHER): Payer: Self-pay | Admitting: Family Medicine

## 2024-03-06 ENCOUNTER — Encounter: Payer: Self-pay | Admitting: Family Medicine

## 2024-03-06 ENCOUNTER — Ambulatory Visit: Payer: Self-pay | Admitting: Family Medicine

## 2024-03-06 VITALS — BP 132/92 | HR 69 | Temp 97.5°F | Ht 63.0 in | Wt 219.0 lb

## 2024-03-06 DIAGNOSIS — E782 Mixed hyperlipidemia: Secondary | ICD-10-CM | POA: Diagnosis not present

## 2024-03-06 DIAGNOSIS — G43809 Other migraine, not intractable, without status migrainosus: Secondary | ICD-10-CM | POA: Diagnosis not present

## 2024-03-06 DIAGNOSIS — E88819 Insulin resistance, unspecified: Secondary | ICD-10-CM

## 2024-03-06 DIAGNOSIS — R7303 Prediabetes: Secondary | ICD-10-CM

## 2024-03-06 DIAGNOSIS — E559 Vitamin D deficiency, unspecified: Secondary | ICD-10-CM

## 2024-03-06 DIAGNOSIS — Z6838 Body mass index (BMI) 38.0-38.9, adult: Secondary | ICD-10-CM

## 2024-03-06 DIAGNOSIS — E66812 Obesity, class 2: Secondary | ICD-10-CM

## 2024-03-06 LAB — COMPREHENSIVE METABOLIC PANEL WITH GFR
ALT: 13 IU/L (ref 0–32)
AST: 16 IU/L (ref 0–40)
Albumin: 3.9 g/dL (ref 3.9–4.9)
Alkaline Phosphatase: 88 IU/L (ref 44–121)
BUN/Creatinine Ratio: 15 (ref 9–23)
BUN: 14 mg/dL (ref 6–24)
Bilirubin Total: 0.5 mg/dL (ref 0.0–1.2)
CO2: 23 mmol/L (ref 20–29)
Calcium: 9.2 mg/dL (ref 8.7–10.2)
Chloride: 105 mmol/L (ref 96–106)
Creatinine, Ser: 0.92 mg/dL (ref 0.57–1.00)
Globulin, Total: 2.5 g/dL (ref 1.5–4.5)
Glucose: 94 mg/dL (ref 70–99)
Potassium: 5.1 mmol/L (ref 3.5–5.2)
Sodium: 139 mmol/L (ref 134–144)
Total Protein: 6.4 g/dL (ref 6.0–8.5)
eGFR: 78 mL/min/{1.73_m2} (ref >59)

## 2024-03-06 LAB — CBC WITH DIFFERENTIAL/PLATELET
Basophils Absolute: 0 10*3/uL (ref 0.0–0.2)
Basos: 0 %
EOS (ABSOLUTE): 0.2 10*3/uL (ref 0.0–0.4)
Eos: 3 %
Hematocrit: 42.6 % (ref 34.0–46.6)
Hemoglobin: 13.7 g/dL (ref 11.1–15.9)
Immature Grans (Abs): 0 10*3/uL (ref 0.0–0.1)
Immature Granulocytes: 0 %
Lymphocytes Absolute: 2.1 10*3/uL (ref 0.7–3.1)
Lymphs: 31 %
MCH: 28.5 pg (ref 26.6–33.0)
MCHC: 32.2 g/dL (ref 31.5–35.7)
MCV: 89 fL (ref 79–97)
Monocytes Absolute: 0.4 10*3/uL (ref 0.1–0.9)
Monocytes: 6 %
Neutrophils Absolute: 4.1 10*3/uL (ref 1.4–7.0)
Neutrophils: 60 %
Platelets: 326 10*3/uL (ref 150–450)
RBC: 4.8 x10E6/uL (ref 3.77–5.28)
RDW: 13.4 % (ref 11.7–15.4)
WBC: 6.8 10*3/uL (ref 3.4–10.8)

## 2024-03-06 LAB — LIPID PANEL
Chol/HDL Ratio: 4.1 ratio (ref 0.0–4.4)
Cholesterol, Total: 171 mg/dL (ref 100–199)
HDL: 42 mg/dL (ref >39)
LDL Chol Calc (NIH): 115 mg/dL — ABNORMAL HIGH (ref 0–99)
Triglycerides: 75 mg/dL (ref 0–149)
VLDL Cholesterol Cal: 14 mg/dL (ref 5–40)

## 2024-03-06 LAB — INSULIN, RANDOM: INSULIN: 16.1 u[IU]/mL (ref 2.6–24.9)

## 2024-03-06 LAB — HEMOGLOBIN A1C
Est. average glucose Bld gHb Est-mCnc: 114 mg/dL
Hgb A1c MFr Bld: 5.6 % (ref 4.8–5.6)

## 2024-03-06 LAB — TSH: TSH: 2.22 u[IU]/mL (ref 0.450–4.500)

## 2024-03-06 LAB — T4, FREE: Free T4: 0.99 ng/dL (ref 0.82–1.77)

## 2024-03-06 LAB — VITAMIN D 25 HYDROXY (VIT D DEFICIENCY, FRACTURES): Vit D, 25-Hydroxy: 33.6 ng/mL (ref 30.0–100.0)

## 2024-03-06 LAB — VITAMIN B12: Vitamin B-12: 357 pg/mL (ref 232–1245)

## 2024-03-06 NOTE — Patient Instructions (Addendum)
 You are welcome to use the Chat GPT app to get in more meal plan ideas: 1500 calorie high fiber / high protein meal plans  Stay of sugary drinks Water / sparkling water  Keep up the walking with a goal of 30+ min 5 days/ wk  Stay off high sugar foods and drinks  The Only Bean - dried edamame Prebagged nuts [employer] Chips

## 2024-03-06 NOTE — Progress Notes (Unsigned)
 Name: Kirsten Poole   MRN: [id number]    DOB: 12-21-1975   Date:03/07/2024       Progress Note  Subjective  Chief Complaint  Chief Complaint  Patient presents with   Annual Exam    HPI  Patient presents for annual CPE and acute issue  Lump behind left ear. She reports it has been there for a few months. She says it has not changed, is not painful.  It is not red or hot to the touch.  Will get soft tissue ultrasound.  Hx of h.pylori: had h. Pylori back in 04/2023,  completed treatment, but did not test for cure.  Will recheck.    Diet: well balanced diet Exercise: walking three times a week, 30-45 min  Sleep: 6-7 hours Last dental exam:a couple weeks ago Last eye exam: a while ago  [employer] Visit from 03/07/2024 in [facility]  AUDIT-C Score 0   Depression: Phq 9 is  negative    03/07/2024    2:05 PM 05/11/2023    1:25 PM 02/25/2023    1:00 PM 01/28/2023    2:58 PM 12/31/2022    3:19 PM  Depression screen PHQ 2/9  Decreased Interest 0 0 0 0 0  Down, Depressed, Hopeless 0 0 0 0 0  PHQ - 2 Score 0 0 0 0 0  Altered sleeping 0    0  Tired, decreased energy 0    0  Change in appetite 0    0  Feeling bad or failure about yourself  0    0  Trouble concentrating 0    0  Moving slowly or fidgety/restless 0    0  Suicidal thoughts 0    0  PHQ-9 Score 0    0  Difficult doing work/chores Not difficult at all    Not difficult at all   Hypertension: BP Readings from Last 3 Encounters:  03/07/24 118/78  03/06/24 (!) 132/92  01/30/24 123/85   Obesity: Wt Readings from Last 3 Encounters:  03/07/24 220 lb 9.6 oz (100.1 kg)  03/06/24 219 lb (99.3 kg)  01/30/24 223 lb (101.2 kg)   BMI Readings from Last 3 Encounters:  03/07/24 38.16 kg/m  03/06/24 38.79 kg/m  01/30/24 39.50 kg/m     Vaccines:  HPV: up to at age 18 , ask insurance if age between 78-45  Shingrix: 92-64 yo and ask insurance if covered when patient above 92  yo Pneumonia:  educated and discussed with patient. Flu:  educated and discussed with patient.  Hep C Screening: completed STD testing and prevention (HIV/chl/gon/syphilis): completed Intimate partner violence: none Sexual History : yes Menstrual History/LMP/Abnormal Bleeding: LMC: IUD Incontinence Symptoms: none  Breast cancer:  - Last Mammogram: 03/09/2023, ordered - BRCA gene screening: none  Osteoporosis: Discussed high calcium and vitamin D  supplementation, weight bearing exercises  Cervical cancer screening: 01/28/2023  Skin cancer: Discussed monitoring for atypical lesions  Colorectal cancer: due   Lung cancer:   Low Dose CT Chest recommended if Age 66-80 years, 20 pack-year currently smoking OR have quit w/in 15years. Patient does not qualify.   ECG: 12/31/2022  Advanced Care Planning: A voluntary discussion about advance care planning including the explanation and discussion of advance directives.  Discussed health care proxy and Living will, and the patient was able to identify a health care proxy as husband.  Patient does not have a living will at present time. If patient does have living will, I  have requested they bring this to the clinic to be scanned in to their chart.  Lipids: Lab Results  Component Value Date   CHOL 171 03/05/2024   CHOL 168 12/31/2022   CHOL 149 12/23/2015   Lab Results  Component Value Date   HDL 42 03/05/2024   HDL 43 (L) 12/31/2022   HDL 43 12/23/2015   Lab Results  Component Value Date   LDLCALC 115 (H) 03/05/2024   LDLCALC 96 12/31/2022   LDLCALC 81 12/23/2015   Lab Results  Component Value Date   TRIG 75 03/05/2024   TRIG 202 (H) 12/31/2022   TRIG 124 12/23/2015   Lab Results  Component Value Date   CHOLHDL 4.1 03/05/2024   CHOLHDL 3.9 12/31/2022   CHOLHDL 3.5 12/23/2015   No results found for: LDLDIRECT  Glucose: Glucose  Date Value Ref Range Status  03/05/2024 94 70 - 99 mg/dL Final  95/88/7982 898 (H) 65 - 99  mg/dL Final   Glucose, Bld  Date Value Ref Range Status  12/31/2022 81 65 - 99 mg/dL Final    Comment:    .            Fasting reference interval .   10/19/2020 98 70 - 99 mg/dL Final    Comment:    Glucose reference range applies only to samples taken after fasting for at least 8 hours.  03/23/2013 80 70 - 99 mg/dL Final    Patient Active Problem List   Diagnosis Date Noted   Vitamin D  deficiency 07/25/2023   Prediabetes 07/25/2023   Eating disorder 07/25/2023   BMI 40.0-44.9, adult (HCC)- current BMI 40.22 07/25/2023   IUD (intrauterine device) in place 02/25/2023   Primary hypertension 12/31/2022   Tachycardia 12/31/2022   Varicose vein of leg 05/16/2015   Morbid obesity with BMI of 40.0-44.9, adult (HCC) 04/08/2015    Past Surgical History:  Procedure Laterality Date   WISDOM TOOTH EXTRACTION      Family History  Problem Relation Age of Onset   Diabetes Mother    Hypertension Mother    Thyroid  disease Mother    Obesity Mother    Atrial fibrillation Father    Stroke Father    Hypertension Father    Hyperlipidemia Father    Heart disease Father    Thyroid  disease Sister    Thyroid  disease Maternal Aunt    Arthritis Maternal Grandmother    Hyperlipidemia Maternal Grandmother    Heart disease Maternal Grandmother    Hypertension Maternal Grandmother    Diabetes Maternal Grandmother    Heart disease Maternal Grandfather        MI   Hypertension Maternal Grandfather    Arthritis Paternal Grandmother    Breast cancer Neg Hx     Social History   Socioeconomic History   Marital status: Married    Spouse name: [place] Miyasaki   Number of children: 3   Years of education: 14   Highest education level: Not on file  Occupational History   Occupation: Surveyor, minerals: [employer]    Comment: [employer]  Tobacco Use   Smoking status: Never   Smokeless tobacco: Never  Vaping Use   Vaping status: Never Used  Substance and  Sexual Activity   Alcohol use: No   Drug use: No   Sexual activity: Yes    Birth control/protection: None  Other Topics Concern   Not on file  Social History Narrative   Kirsten Poole  grew up in [place]. She lives at home with her husband of 15 years that their 3 children (Kirsten Poole 12, Kirsten Poole 10, Kirsten Poole 9). They have a dog and cat. 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 She attended [employer] for her pre-requisites in nursing but realized this was not what she wanted to do. She is very busy with her 3 boys and baseball.    Social Drivers of Corporate investment banker Strain: Low Risk  (03/07/2024)   Overall Financial Resource Strain (CARDIA)    Difficulty of Paying Living Expenses: Not hard at all  Food Insecurity: No Food Insecurity (03/07/2024)   Hunger Vital Sign    Worried About Running Out of Food in the Last Year: Never true    Ran Out of Food in the Last Year: Never true  Transportation Needs: No Transportation Needs (03/07/2024)   PRAPARE - Administrator, Civil Service (Medical): No    Lack of Transportation (Non-Medical): No  Physical Activity: Insufficiently Active (03/07/2024)   Exercise Vital Sign    Days of Exercise per Week: 4 days    Minutes of Exercise per Session: 30 min  Stress: No Stress Concern Present (03/07/2024)   [employer] of Occupational Health - Occupational Stress Questionnaire    Feeling of Stress: Not at all  Social Connections: Socially Integrated (03/07/2024)   Social Connection and Isolation Panel    Frequency of Communication with Friends and Family: More than three times a week    Frequency of Social Gatherings with Friends and Family: More than three times a week    Attends Religious Services: More than 4 times per year    Active Member of [employer] or Organizations: Yes    Attends Banker Meetings: More than 4 times per year    Marital Status: Married  Catering manager Violence: Not At Risk  (03/07/2024)   Humiliation, Afraid, Rape, and Kick questionnaire    Fear of Current or Ex-Partner: No    Emotionally Abused: No    Physically Abused: No    Sexually Abused: No     Current Outpatient Medications:    Vitamin D , Ergocalciferol , (DRISDOL ) 1.25 MG (50000 UNIT) CAPS capsule, Take 1 capsule (50,000 Units total) by mouth every 7 (seven) days., Disp: 12 capsule, Rfl: 0  Allergies  Allergen Reactions   Other     Other reaction(s): Other (See Comments) Seasonal outside allergies (sneezing)     ROS  Constitutional: Negative for fever or weight change.  Respiratory: Negative for cough and shortness of breath.   Cardiovascular: Negative for chest pain or palpitations.  Gastrointestinal: Negative for abdominal pain, no bowel changes.  Musculoskeletal: Negative for gait problem or joint swelling.  Skin: Negative for rash.  Neurological: Negative for dizziness or headache.  No other specific complaints in a complete review of systems (except as listed in HPI above).   Objective  Vitals:   03/07/24 1401  BP: 118/78  Pulse: 92  Resp: 16  Temp: 98.1 F (36.7 C)  SpO2: 95%  Weight: 220 lb 9.6 oz (100.1 kg)  Height: 5' 3.75 (1.619 m)    Body mass index is 38.16 kg/m.  Physical Exam Vitals reviewed.  Constitutional:      Appearance: Normal appearance.  HENT:     Head: Normocephalic.     Right Ear: Tympanic membrane normal.     Left Ear: Tympanic membrane normal.     Nose: Nose normal.   Eyes:  Extraocular Movements: Extraocular movements intact.     Conjunctiva/sclera: Conjunctivae normal.     Pupils: Pupils are equal, round, and reactive to light.   Neck:     Thyroid : No thyroid  mass, thyromegaly or thyroid  tenderness.      Comments: Nodule on left side of neck, behind ear Cardiovascular:     Rate and Rhythm: Normal rate and regular rhythm.     Pulses: Normal pulses.     Heart sounds: Normal heart sounds.  Pulmonary:     Effort: Pulmonary effort  is normal.     Breath sounds: Normal breath sounds.  Abdominal:     General: Bowel sounds are normal.     Palpations: Abdomen is soft.   Musculoskeletal:        General: Normal range of motion.     Cervical back: Normal range of motion and neck supple.     Right lower leg: No edema.     Left lower leg: No edema.   Skin:    General: Skin is warm and dry.     Capillary Refill: Capillary refill takes less than 2 seconds.   Neurological:     General: No focal deficit present.     Mental Status: She is alert and oriented to person, place, and time. Mental status is at baseline.   Psychiatric:        Mood and Affect: Mood normal.        Behavior: Behavior normal.        Thought Content: Thought content normal.        Judgment: Judgment normal.      Recent Results (from the past 2160 hours)  Vitamin B12     Status: None   Collection Time: 03/05/24  8:42 AM  Result Value Ref Range   Vitamin B-12 357 232 - 1,245 pg/mL  VITAMIN D  25 Hydroxy (Vit-D Deficiency, Fractures)     Status: None   Collection Time: 03/05/24  8:42 AM  Result Value Ref Range   Vit D, 25-Hydroxy 33.6 30.0 - 100.0 ng/mL    Comment: Vitamin D  deficiency has been defined by the Institute of Medicine and an Endocrine Society practice guideline as a level of serum 25-OH vitamin D  less than 20 ng/mL (1,2). The Endocrine Society went on to further define vitamin D  insufficiency as a level between 21 and 29 ng/mL (2). 1. IOM (Institute of Medicine). 2010. Dietary reference    intakes for calcium and D. [place]: The    [employer]. 2. Holick MF, Binkley [place], Bischoff-Ferrari HA, et al.    Evaluation, treatment, and prevention of vitamin D     deficiency: an Endocrine Society clinical practice    guideline. JCEM. 2011 Jul; 96(7):1911-30.   TSH     Status: None   Collection Time: 03/05/24  8:42 AM  Result Value Ref Range   TSH 2.220 0.450 - 4.500 uIU/mL  T4, free     Status: None   Collection  Time: 03/05/24  8:42 AM  Result Value Ref Range   Free T4 0.99 0.82 - 1.77 ng/dL  Lipid panel     Status: Abnormal   Collection Time: 03/05/24  8:42 AM  Result Value Ref Range   Cholesterol, Total 171 100 - 199 mg/dL   Triglycerides 75 0 - 149 mg/dL   HDL 42 >60 mg/dL   VLDL Cholesterol Cal 14 5 - 40 mg/dL   LDL Chol Calc (NIH) 884 (H) 0 - 99 mg/dL  Chol/HDL Ratio 4.1 0.0 - 4.4 ratio    Comment:                                   T. Chol/HDL Ratio                                             Men  Women                               1/2 Avg.Risk  3.4    3.3                                   Avg.Risk  5.0    4.4                                2X Avg.Risk  9.6    7.1                                3X Avg.Risk 23.4   11.0   Hemoglobin A1c     Status: None   Collection Time: 03/05/24  8:42 AM  Result Value Ref Range   Hgb A1c MFr Bld 5.6 4.8 - 5.6 %    Comment:          Prediabetes: 5.7 - 6.4          Diabetes: >6.4          Glycemic control for adults with diabetes: <7.0    Est. average glucose Bld gHb Est-mCnc 114 mg/dL  Insulin , random     Status: None   Collection Time: 03/05/24  8:42 AM  Result Value Ref Range   INSULIN  16.1 2.6 - 24.9 uIU/mL  Comprehensive metabolic panel with GFR     Status: None   Collection Time: 03/05/24  8:42 AM  Result Value Ref Range   Glucose 94 70 - 99 mg/dL   BUN 14 6 - 24 mg/dL   Creatinine, Ser 9.07 0.57 - 1.00 mg/dL   eGFR 78 >40 fO/fpw/8.26   BUN/Creatinine Ratio 15 9 - 23   Sodium 139 134 - 144 mmol/L   Potassium 5.1 3.5 - 5.2 mmol/L   Chloride 105 96 - 106 mmol/L   CO2 23 20 - 29 mmol/L   Calcium 9.2 8.7 - 10.2 mg/dL   Total Protein 6.4 6.0 - 8.5 g/dL   Albumin 3.9 3.9 - 4.9 g/dL   Globulin, Total 2.5 1.5 - 4.5 g/dL   Bilirubin Total 0.5 0.0 - 1.2 mg/dL   Alkaline Phosphatase 88 44 - 121 IU/L   AST 16 0 - 40 IU/L   ALT 13 0 - 32 IU/L  CBC with Differential/Platelet     Status: None   Collection Time: 03/05/24  8:42 AM  Result  Value Ref Range   WBC 6.8 3.4 - 10.8 x10E3/uL   RBC 4.80 3.77 - 5.28 x10E6/uL   Hemoglobin 13.7 11.1 - 15.9 g/dL   Hematocrit 57.3 65.9 - 46.6 %   MCV 89 79 - 97 fL   MCH  28.5 26.6 - 33.0 pg   MCHC 32.2 31.5 - 35.7 g/dL   RDW 86.5 88.2 - 84.5 %   Platelets 326 150 - 450 x10E3/uL   Neutrophils 60 Not Estab. %   Lymphs 31 Not Estab. %   Monocytes 6 Not Estab. %   Eos 3 Not Estab. %   Basos 0 Not Estab. %   Neutrophils Absolute 4.1 1.4 - 7.0 x10E3/uL   Lymphocytes Absolute 2.1 0.7 - 3.1 x10E3/uL   Monocytes Absolute 0.4 0.1 - 0.9 x10E3/uL   EOS (ABSOLUTE) 0.2 0.0 - 0.4 x10E3/uL   Basophils Absolute 0.0 0.0 - 0.2 x10E3/uL   Immature Granulocytes 0 Not Estab. %   Immature Grans (Abs) 0.0 0.0 - 0.1 x10E3/uL    Fall Risk:    03/07/2024    2:04 PM 05/11/2023    1:25 PM 02/25/2023    1:00 PM 01/28/2023    2:58 PM 12/31/2022    3:19 PM  Fall Risk   Falls in the past year? 0 0 0 0 0  Number falls in past yr: 0 0 0 0 0  Injury with Fall? 0 0 0 0 0  Follow up Falls evaluation completed         Functional Status Survey: Is the patient deaf or have difficulty hearing?: No Does the patient have difficulty seeing, even when wearing glasses/contacts?: No Does the patient have difficulty concentrating, remembering, or making decisions?: No Does the patient have difficulty walking or climbing stairs?: No Does the patient have difficulty dressing or bathing?: No Does the patient have difficulty doing errands alone such as visiting a doctor's office or shopping?: No   Assessment & Plan  1. Annual physical exam (Primary)  - MM 3D SCREENING MAMMOGRAM BILATERAL BREAST; Future  2. Encounter for screening mammogram for malignant neoplasm of breast  - MM 3D SCREENING MAMMOGRAM BILATERAL BREAST; Future  3. Screening for colon cancer  - Cologuard  4. Lump in neck  - US  Soft Tissue Head/Neck (NON-THYROID ); Future  5. History of Helicobacter pylori infection  - H. pylori breath test    -USPSTF grade A and B recommendations reviewed with patient; age-appropriate recommendations, preventive care, screening tests, etc discussed and encouraged; healthy living encouraged; see AVS for patient education given to patient -Discussed importance of 150 minutes of physical activity weekly, eat two servings of fish weekly, eat one serving of tree nuts ( cashews, pistachios, pecans, almonds.SABRA) every other day, eat 6 servings of fruit/vegetables daily and drink plenty of water and avoid sweet beverages.   -Reviewed Health Maintenance: yes

## 2024-03-06 NOTE — Progress Notes (Unsigned)
 Office: [phone]  /  Fax: [phone]  WEIGHT SUMMARY AND BIOMETRICS  Starting Date: 02/23/23  Starting Weight: 232lb   Weight Lost Since Last Visit: 4lb   Vitals Temp: (!) 97.5 F (36.4 C) BP: (!) 132/92 Pulse Rate: 69 SpO2: 97 %   Body Composition  Body Fat %: 49.9 % Fat Mass (lbs): 102.8 lbs Muscle Mass (lbs): 110.4 lbs Total Body Water (lbs): 85.4 lbs Visceral Fat Rating : 13   HPI  Chief Complaint: OBESITY  Kirsten Poole is here to discuss her progress with her obesity treatment plan. She is on the the Category 2 Plan and states she is following her eating plan approximately 70 % of the time. She states she is exercising 45-60 minutes 2-3 times per week.  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 Body mass index is 38.79 kg/m.  General: She is overweight, cooperative, alert, well developed, and in no acute distress. PSYCH: Has normal mood, affect and thought process.   Lungs: Normal breathing effort, no conversational dyspnea.   ASSESSMENT AND PLAN  TREATMENT PLAN FOR OBESITY:  Recommended Dietary Goals  Kirsten Poole is currently in the action stage of change. As such, her goal is to continue weight management plan. She has agreed to {MWMwtlossportion/plan2:23431}.  Behavioral Intervention  We discussed the following Behavioral  Modification Strategies today: {EMWMwtlossstrategies:28914::continue to work on maintaining a reduced calorie state, getting the recommended amount of protein, incorporating whole foods, making healthy choices, staying well hydrated and practicing mindfulness when eating.}.  Additional resources provided today: NA  Recommended Physical Activity Goals  Kirsten Poole has been advised to work up to 150 minutes of moderate intensity aerobic activity a week and strengthening exercises 2-3 times per week for cardiovascular health, weight loss maintenance and preservation of muscle mass.   She has agreed to {EMEXERCISE:28847::Think about enjoyable ways to increase daily physical activity and overcoming barriers to exercise,Increase physical activity in their day and reduce sedentary time (increase NEAT).}  Pharmacotherapy changes for the treatment of obesity:   ASSOCIATED CONDITIONS ADDRESSED TODAY  There are no diagnoses linked to this encounter.    She was informed of the importance of frequent follow up visits to maximize her success with intensive lifestyle modifications for her multiple health conditions.   ATTESTASTION STATEMENTS:  Reviewed by clinician on day of visit: allergies, medications, problem list, medical history, surgical history, family history, social history, and previous encounter notes pertinent to obesity diagnosis.   I have personally spent 30 minutes total time today in preparation, patient care, nutritional counseling and education,  and documentation for this visit, including the following: review of most recent clinical lab tests, prescribing medications/ refilling medications, reviewing medical assistant documentation, review and interpretation of bioimpedence results.     Darice Haddock, D.O. DABFM, DABOM [facility] [id number] [place], [place] [address] [phone]

## 2024-03-07 ENCOUNTER — Ambulatory Visit (INDEPENDENT_AMBULATORY_CARE_PROVIDER_SITE_OTHER): Payer: Self-pay | Admitting: Nurse Practitioner

## 2024-03-07 ENCOUNTER — Encounter: Payer: Self-pay | Admitting: Nurse Practitioner

## 2024-03-07 VITALS — BP 118/78 | HR 92 | Temp 98.1°F | Resp 16 | Ht 63.75 in | Wt 220.6 lb

## 2024-03-07 DIAGNOSIS — Z1231 Encounter for screening mammogram for malignant neoplasm of breast: Secondary | ICD-10-CM | POA: Diagnosis not present

## 2024-03-07 DIAGNOSIS — R221 Localized swelling, mass and lump, neck: Secondary | ICD-10-CM | POA: Diagnosis not present

## 2024-03-07 DIAGNOSIS — Z0000 Encounter for general adult medical examination without abnormal findings: Secondary | ICD-10-CM

## 2024-03-07 DIAGNOSIS — Z1211 Encounter for screening for malignant neoplasm of colon: Secondary | ICD-10-CM | POA: Diagnosis not present

## 2024-03-07 DIAGNOSIS — Z8619 Personal history of other infectious and parasitic diseases: Secondary | ICD-10-CM | POA: Diagnosis not present

## 2024-03-08 ENCOUNTER — Ambulatory Visit: Payer: Self-pay | Admitting: Nurse Practitioner

## 2024-03-08 LAB — H. PYLORI BREATH TEST: H. pylori Breath Test: NOT DETECTED

## 2024-03-23 ENCOUNTER — Encounter: Payer: Self-pay | Admitting: Nurse Practitioner

## 2024-03-23 ENCOUNTER — Other Ambulatory Visit: Payer: Self-pay

## 2024-03-23 DIAGNOSIS — Z1211 Encounter for screening for malignant neoplasm of colon: Secondary | ICD-10-CM

## 2024-03-26 ENCOUNTER — Telehealth: Payer: Self-pay

## 2024-03-26 ENCOUNTER — Other Ambulatory Visit: Payer: Self-pay

## 2024-03-26 DIAGNOSIS — Z1211 Encounter for screening for malignant neoplasm of colon: Secondary | ICD-10-CM

## 2024-03-26 MED ORDER — NA SULFATE-K SULFATE-MG SULF 17.5-3.13-1.6 GM/177ML PO SOLN: 1.0000 | Freq: Once | ORAL | 0 refills | Status: AC

## 2024-03-26 NOTE — Telephone Encounter (Signed)
 Gastroenterology Pre-Procedure Review  Request Date: 04/17/24 Requesting Physician: Dr. Jinny   PATIENT REVIEW QUESTIONS: The patient responded to the following health history questions as indicated:    1. Are you having any GI issues? no 2. Do you have a personal history of Polyps? no 3. Do you have a family history of Colon Cancer or Polyps? no 4. Diabetes Mellitus? no 5. Joint replacements in the past 12 months?no 6. Major health problems in the past 3 months?no 7. Any artificial heart valves, MVP, or defibrillator?no    MEDICATIONS & ALLERGIES:    Patient reports the following regarding taking any anticoagulation/antiplatelet therapy:   Plavix, Coumadin, Eliquis, Xarelto, Lovenox, Pradaxa, Brilinta, or Effient? no Aspirin? no  Patient confirms/reports the following medications:  Current Outpatient Medications  Medication Sig Dispense Refill   Vitamin D , Ergocalciferol , (DRISDOL ) 1.25 MG (50000 UNIT) CAPS capsule Take 1 capsule (50,000 Units total) by mouth every 7 (seven) days. 12 capsule 0   No current facility-administered medications for this visit.    Patient confirms/reports the following allergies:  Allergies  Allergen Reactions   Other     Other reaction(s): Other (See Comments) Seasonal outside allergies (sneezing)    No orders of the defined types were placed in this encounter.   AUTHORIZATION INFORMATION Primary Insurance: 1D#: Group #:  Secondary Insurance: 1D#: Group #:  SCHEDULE INFORMATION: Date:  Time: Location:

## 2024-03-31 LAB — COLOGUARD

## 2024-04-10 ENCOUNTER — Encounter: Payer: Self-pay | Admitting: Family Medicine

## 2024-04-10 ENCOUNTER — Ambulatory Visit: Admitting: Family Medicine

## 2024-04-10 VITALS — BP 115/79 | HR 62 | Temp 97.8°F | Ht 63.0 in | Wt 219.0 lb

## 2024-04-10 DIAGNOSIS — E559 Vitamin D deficiency, unspecified: Secondary | ICD-10-CM | POA: Diagnosis not present

## 2024-04-10 DIAGNOSIS — E66812 Obesity, class 2: Secondary | ICD-10-CM

## 2024-04-10 DIAGNOSIS — E785 Hyperlipidemia, unspecified: Secondary | ICD-10-CM | POA: Diagnosis not present

## 2024-04-10 DIAGNOSIS — Z6838 Body mass index (BMI) 38.0-38.9, adult: Secondary | ICD-10-CM

## 2024-04-10 DIAGNOSIS — R7303 Prediabetes: Secondary | ICD-10-CM | POA: Diagnosis not present

## 2024-04-10 NOTE — Patient Instructions (Addendum)
 Start Category 3 meal plan  Continue to work on increasing walking time and home exercise  Continue RX vitamin D  weekly  Read labels on food and drink for sugar Avoid products with over 8 g of added sugar per serving

## 2024-04-10 NOTE — Progress Notes (Signed)
 Office: [phone]  /  Fax: [phone]  WEIGHT SUMMARY AND BIOMETRICS  Starting Date: 02/23/23  Starting Weight: 232lb   Weight Lost Since Last Visit: 0lb   Vitals Temp: 97.8 F (36.6 C) BP: 115/79 Pulse Rate: 62 SpO2: 98 %   Body Composition  Body Fat %: 42.4 % Fat Mass (lbs): 93 lbs Muscle Mass (lbs): 120 lbs Total Body Water (lbs): 84.6 lbs Visceral Fat Rating : 12    HPI  Chief Complaint: OBESITY  Kirsten Poole is here to discuss her progress with her obesity treatment plan. She is on the the Category 2 Plan and states she is following her eating plan approximately 50 % of the time. She states she is exercising 30 minutes 1 times per week.  Interval History:  Since last office visit she is down 0 lb She has been doing some free weights and body weight training from home She did go to the beach for a week and did eat more sweets She has a net weight loss of 13 lb in 13 mos of medically supervised weight management This is a 5.6% TBW loss She has been working on meal planning and mindful eating She is not tracking her calories  Pharmacotherapy: none (she has declined)  PHYSICAL EXAM:  Blood pressure 115/79, pulse 62, temperature 97.8 F (36.6 C), height 5' 3 (1.6 m), weight 219 lb (99.3 kg), SpO2 98%. Body mass index is 38.79 kg/m.  General: She is overweight,here with husband,  cooperative, alert, well developed, and in no acute distress. PSYCH: Has normal mood, affect and thought process.   Lungs: Normal breathing effort, no conversational dyspnea.   ASSESSMENT AND PLAN  TREATMENT PLAN FOR OBESITY:  Recommended Dietary Goals  Kirsten Poole is currently in the action stage of change. As such, her goal is to continue weight management plan. She has agreed to the Category 3 Plan. Copy of cat 3 meal plan + 200 cal snack list given RMR 1728  Behavioral Intervention  We discussed the following Behavioral Modification Strategies today: increasing lean  protein intake to established goals, increasing fiber rich foods, increasing water intake , work on meal planning and preparation, work on tracking and journaling calories using tracking application, keeping healthy foods at home, decreasing eating out or consumption of processed foods, and making healthy choices when eating convenient foods, work on managing stress, creating time for self-care and relaxation, avoiding temptations and identifying enticing environmental cues, and continue to work on maintaining a reduced calorie state, getting the recommended amount of protein, incorporating whole foods, making healthy choices, staying well hydrated and practicing mindfulness when eating..  Additional resources provided today: NA  Recommended Physical Activity Goals  Kirsten Poole has been advised to work up to 150 minutes of moderate intensity aerobic activity a week and strengthening exercises 2-3 times per week for cardiovascular health, weight loss maintenance and preservation of muscle mass.   She has agreed to Start aerobic activity with a goal of 150 minutes a week at moderate intensity.   Pharmacotherapy changes for the treatment of obesity: brochure on Qsymia given  ASSOCIATED CONDITIONS ADDRESSED TODAY  Prediabetes Lab Results  Component Value Date   HGBA1C 5.6 03/05/2024   Improved Her A1c has normalized with dietary changes and weight loss She is not on metformin but her fasting insulin  remains high at 16.1  Add in regular exercise with a daily step goal of 8,000 per day or more Avoid high sugar foods and drinks  Class 2 severe obesity due  to excess calories with serious comorbidity and body mass index (BMI) of 38.0 to 38.9 in adult [facility])  Vitamin D  deficiency Last vitamin D  Lab Results  Component Value Date   VD25OH 33.6 03/05/2024  Taking vitamin D  50,000 IU once weekly.  Declined need for refills today.  Repeat lab in the next 1 to 2 months  Dyslipidemia (high LDL; low  HDL) Lab Results  Component Value Date   CHOL 171 03/05/2024   HDL 42 03/05/2024   LDLCALC 115 (H) 03/05/2024   TRIG 75 03/05/2024   CHOLHDL 4.1 03/05/2024  She has mildly elevated LDL and low HDL levels consistent with dyslipidemia.  She has been working on reducing her intake of saturated fat and sugar.  Continue active plan for weight reduction.  Follow-up with PCP for further evaluation and treatment.     She was informed of the importance of frequent follow up visits to maximize her success with intensive lifestyle modifications for her multiple health conditions.   ATTESTASTION STATEMENTS:  Reviewed by clinician on day of visit: allergies, medications, problem list, medical history, surgical history, family history, social history, and previous encounter notes pertinent to obesity diagnosis.   I have personally spent 30 minutes total time today in preparation, patient care, nutritional counseling and education,  and documentation for this visit, including the following: review of most recent clinical lab tests,  reviewing medical assistant documentation, review and interpretation of bioimpedence results.     Kirsten Poole, D.O. DABFM, DABOM [facility] [address] [place], [place] [address] [phone]

## 2024-04-17 ENCOUNTER — Ambulatory Visit: Admitting: Anesthesiology

## 2024-04-17 ENCOUNTER — Other Ambulatory Visit: Payer: Self-pay

## 2024-04-17 ENCOUNTER — Ambulatory Visit
Admission: RE | Admit: 2024-04-17 | Discharge: 2024-04-17 | Disposition: A | Discharge status: Home / Self Care | Attending: Gastroenterology | Admitting: Gastroenterology

## 2024-04-17 ENCOUNTER — Encounter
Admission: RE | Disposition: A | Discharge status: Home / Self Care | Payer: Self-pay | Source: Home / Self Care | Attending: Gastroenterology

## 2024-04-17 ENCOUNTER — Encounter: Payer: Self-pay | Admitting: Gastroenterology

## 2024-04-17 DIAGNOSIS — D124 Benign neoplasm of descending colon: Secondary | ICD-10-CM | POA: Insufficient documentation

## 2024-04-17 DIAGNOSIS — Z79899 Other long term (current) drug therapy: Secondary | ICD-10-CM | POA: Insufficient documentation

## 2024-04-17 DIAGNOSIS — K5730 Diverticulosis of large intestine without perforation or abscess without bleeding: Secondary | ICD-10-CM | POA: Diagnosis not present

## 2024-04-17 DIAGNOSIS — I10 Essential (primary) hypertension: Secondary | ICD-10-CM | POA: Insufficient documentation

## 2024-04-17 DIAGNOSIS — D123 Benign neoplasm of transverse colon: Secondary | ICD-10-CM | POA: Diagnosis not present

## 2024-04-17 DIAGNOSIS — K640 First degree hemorrhoids: Secondary | ICD-10-CM | POA: Insufficient documentation

## 2024-04-17 DIAGNOSIS — Z1211 Encounter for screening for malignant neoplasm of colon: Secondary | ICD-10-CM | POA: Insufficient documentation

## 2024-04-17 DIAGNOSIS — K635 Polyp of colon: Secondary | ICD-10-CM | POA: Insufficient documentation

## 2024-04-17 HISTORY — PX: COLONOSCOPY: SHX5424

## 2024-04-17 HISTORY — PX: POLYPECTOMY: SHX149

## 2024-04-17 LAB — POCT PREGNANCY, URINE: Preg Test, Ur: NEGATIVE

## 2024-04-17 SURGERY — COLONOSCOPY
Anesthesia: General

## 2024-04-17 MED ORDER — DEXMEDETOMIDINE HCL IN NACL 80 MCG/20ML IV SOLN
INTRAVENOUS | Status: DC | PRN
Start: 2024-04-17 — End: 2024-04-17
  Administered 2024-04-17: 8 ug via INTRAVENOUS
  Administered 2024-04-17: 12 ug via INTRAVENOUS

## 2024-04-17 MED ORDER — LIDOCAINE HCL (CARDIAC) PF 100 MG/5ML IV SOSY
PREFILLED_SYRINGE | INTRAVENOUS | Status: DC | PRN
  Administered 2024-04-17: 60 mg via INTRAVENOUS

## 2024-04-17 MED ORDER — LIDOCAINE HCL (PF) 2 % IJ SOLN
INTRAMUSCULAR | Status: AC
  Filled 2024-04-17: qty 5

## 2024-04-17 MED ORDER — DEXMEDETOMIDINE HCL IN NACL 80 MCG/20ML IV SOLN
INTRAVENOUS | Status: AC
  Filled 2024-04-17: qty 20

## 2024-04-17 MED ORDER — PROPOFOL 10 MG/ML IV BOLUS
INTRAVENOUS | Status: DC | PRN
  Administered 2024-04-17 (×2): 40 mg via INTRAVENOUS

## 2024-04-17 MED ORDER — PROPOFOL 500 MG/50ML IV EMUL
INTRAVENOUS | Status: DC | PRN
  Administered 2024-04-17: 75 ug/kg/min via INTRAVENOUS

## 2024-04-17 MED ORDER — SODIUM CHLORIDE 0.9 % IV SOLN: INTRAVENOUS | Status: DC

## 2024-04-17 NOTE — H&P (Signed)
 Kirsten Copping, MD [facility] [address], [address] [place], [place] [address] Phone: [phone] Fax : [phone]  Primary Care Physician:  Kirsten Mliss FALCON, FNP Primary Gastroenterologist:  Dr. Copping  Pre-Procedure History & Physical: HPI:  Kirsten Poole is a 48 y.o. female is here for a screening colonoscopy.   Past Medical History:  Diagnosis Date   History of swelling of feet    Hypertension    Migraine    Seasonal allergies    UTI (lower urinary tract infection)     Past Surgical History:  Procedure Laterality Date   WISDOM TOOTH EXTRACTION      Prior to Admission medications   Medication Sig Start Date End Date Taking? Authorizing Provider  cetirizine (ZYRTEC) 10 MG chewable tablet Chew 10 mg by mouth daily.   Yes [provider]  levonorgestrel  (MIRENA ) 20 MCG/DAY IUD 1 each by Intrauterine route once.   Yes [provider]  omega-3 acid ethyl esters (LOVAZA) 1 g capsule Take by mouth 2 (two) times daily.   Yes [provider]  Vitamin D , Ergocalciferol , (DRISDOL ) 1.25 MG (50000 UNIT) CAPS capsule Take 1 capsule (50,000 Units total) by mouth every 7 (seven) days. 01/30/24   Midge Sober, DO    Allergies as of 03/27/2024 - Review Complete 03/07/2024  Allergen Reaction Noted   Other  01/23/2016    Family History  Problem Relation Age of Onset   Diabetes Mother    Hypertension Mother    Thyroid  disease Mother    Obesity Mother    Atrial fibrillation Father    Stroke Father    Hypertension Father    Hyperlipidemia Father    Heart disease Father    Thyroid  disease Sister    Thyroid  disease Maternal Aunt    Arthritis Maternal Grandmother    Hyperlipidemia Maternal Grandmother    Heart disease Maternal Grandmother    Hypertension Maternal Grandmother    Diabetes Maternal Grandmother    Heart disease Maternal Grandfather        MI   Hypertension Maternal Grandfather    Arthritis Paternal Grandmother    Breast cancer Neg Hx      Social History   Socioeconomic History   Marital status: Married    Spouse name: Kirsten Poole   Number of children: 3   Years of education: 14   Highest education level: Not on file  Occupational History   Occupation: Surveyor, minerals: [employer]    Comment: [employer]  Tobacco Use   Smoking status: Never   Smokeless tobacco: Never  Vaping Use   Vaping status: Never Used  Substance and Sexual Activity   Alcohol use: No   Drug use: No   Sexual activity: Yes    Birth control/protection: None  Other Topics Concern   Not on file  Social History Narrative   Kirsten Poole grew up in [place]. She lives at home with her husband of 15 years that their 3 children (Kirsten Poole 12, Kirsten Poole 10, Kirsten Poole 9). They have a dog and cat. 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 She attended [employer] for her pre-requisites in nursing but realized this was not what she wanted to do. She is very busy with her 3 boys and baseball.    Social Drivers of Corporate investment banker Strain: Low Risk  (03/07/2024)   Overall Financial Resource Strain (CARDIA)    Difficulty of Paying Living Expenses: Not hard  at all  Food Insecurity: No Food Insecurity (03/07/2024)   Hunger Vital Sign    Worried About Running Out of Food in the Last Year: Never true    Ran Out of Food in the Last Year: Never true  Transportation Needs: No Transportation Needs (03/07/2024)   PRAPARE - Administrator, Civil Service (Medical): No    Lack of Transportation (Non-Medical): No  Physical Activity: Insufficiently Active (03/07/2024)   Exercise Vital Sign    Days of Exercise per Week: 4 days    Minutes of Exercise per Session: 30 min  Stress: No Stress Concern Present (03/07/2024)   [employer] of Occupational Health - Occupational Stress Questionnaire    Feeling of Stress: Not at all  Social Connections: Socially Integrated (03/07/2024)   Social  Connection and Isolation Panel    Frequency of Communication with Friends and Family: More than three times a week    Frequency of Social Gatherings with Friends and Family: More than three times a week    Attends Religious Services: More than 4 times per year    Active Member of [employer] or Organizations: Yes    Attends Engineer, structural: More than 4 times per year    Marital Status: Married  Catering manager Violence: Not At Risk (03/07/2024)   Humiliation, Afraid, Rape, and Kick questionnaire    Fear of Current or Ex-Partner: No    Emotionally Abused: No    Physically Abused: No    Sexually Abused: No    Review of Systems: See HPI, otherwise negative ROS  Physical Exam: BP (!) 129/96   Pulse 83   Temp (!) 96.6 F (35.9 C) (Temporal)   Resp 14   Ht 5' 3 (1.6 m)   Wt 98.9 kg   SpO2 97%   BMI 38.62 kg/m  General:   Alert,  pleasant and cooperative in NAD Head:  Normocephalic and atraumatic. Neck:  Supple; no masses or thyromegaly. Lungs:  Clear throughout to auscultation.    Heart:  Regular rate and rhythm. Abdomen:  Soft, nontender and nondistended. Normal bowel sounds, without guarding, and without rebound.   Neurologic:  Alert and  oriented x4;  grossly normal neurologically.  Impression/Plan: Kirsten Poole is now here to undergo a screening colonoscopy.  Risks, benefits, and alternatives regarding colonoscopy have been reviewed with the patient.  Questions have been answered.  All parties agreeable.

## 2024-04-17 NOTE — Transfer of Care (Signed)
 Immediate Anesthesia Transfer of Care Note  Patient: Kirsten Poole  Procedure(s) Performed: COLONOSCOPY POLYPECTOMY, INTESTINE  Patient Location: PACU  Anesthesia Type:General  Level of Consciousness: sedated  Airway & Oxygen Therapy: Patient Spontanous Breathing  Post-op Assessment: Report given to RN and Post -op Vital signs reviewed and stable  Post vital signs: Reviewed and stable  Last Vitals:  Vitals Value Taken Time  BP 89/57 04/17/24 08:51  Temp    Pulse 68 04/17/24 08:51  Resp 16 04/17/24 08:51  SpO2 96 % 04/17/24 08:51  Vitals shown include unfiled device data.  Last Pain:  Vitals:   04/17/24 0750  TempSrc: Temporal  PainSc: 0-No pain         Complications: No notable events documented.

## 2024-04-17 NOTE — Op Note (Signed)
 [facility] Gastroenterology Patient Name: Kirsten Poole Procedure Date: 04/17/2024 8:20 AM MRN: [id number] Account #: [id number] Date of Birth: November 05, 1975 Admit Type: Outpatient Age: 48 Room: [facility] ENDO ROOM 4 Gender: Female Note Status: Finalized Instrument Name: Arvis [id number] Procedure:             Colonoscopy Indications:           Screening for colorectal malignant neoplasm Providers:             [name] MD, MD Referring MD:          Mliss FALCON. Pender (Referring MD) Medicines:             Propofol  per Anesthesia Complications:         No immediate complications. Procedure:             Pre-Anesthesia Assessment:                        - Prior to the procedure, a History and Physical was                         performed, and patient medications and allergies were                         reviewed. The patient's tolerance of previous                         anesthesia was also reviewed. The risks and benefits                         of the procedure and the sedation options and risks                         were discussed with the patient. All questions were                         answered, and informed consent was obtained. Prior                         Anticoagulants: The patient has taken no anticoagulant                         or antiplatelet agents. ASA Grade Assessment: II - A                         patient with mild systemic disease. After reviewing                         the risks and benefits, the patient was deemed in                         satisfactory condition to undergo the procedure.                        After obtaining informed consent, the colonoscope was                         passed under direct vision. Throughout the procedure,  the patient's blood pressure, pulse, and oxygen                         saturations were monitored continuously. The                         Colonoscope was introduced through the  anus and                         advanced to the the cecum, identified by appendiceal                         orifice and ileocecal valve. The colonoscopy was                         performed without difficulty. The patient tolerated                         the procedure well. The quality of the bowel                         preparation was excellent. Findings:      The perianal and digital rectal examinations were normal.      A 3 mm polyp was found in the ascending colon. The polyp was sessile.       The polyp was removed with a cold snare. Resection and retrieval were       complete.      A 5 mm polyp was found in the transverse colon. The polyp was sessile.       The polyp was removed with a cold snare. Resection and retrieval were       complete.      A 4 mm polyp was found in the descending colon. The polyp was sessile.       The polyp was removed with a cold snare. Resection and retrieval were       complete.      Multiple small-mouthed diverticula were found in the sigmoid colon.      Non-bleeding internal hemorrhoids were found during retroflexion. The       hemorrhoids were Grade I (internal hemorrhoids that do not prolapse). Impression:            - One 3 mm polyp in the ascending colon, removed with                         a cold snare. Resected and retrieved.                        - One 5 mm polyp in the transverse colon, removed with                         a cold snare. Resected and retrieved.                        - One 4 mm polyp in the descending colon, removed with                         a cold snare. Resected and retrieved.                        -  Diverticulosis in the sigmoid colon.                        - Non-bleeding internal hemorrhoids. Recommendation:        - Discharge patient to home.                        - Resume previous diet.                        - Continue present medications.                        - Await pathology results.                         - If the pathology report reveals adenomatous tissue,                         then repeat the colonoscopy for surveillance in 5                         years. Procedure Code(s):     --- Professional ---                        [id number], Colonoscopy, flexible; with removal of                         tumor(s), polyp(s), or other lesion(s) by snare                         technique Diagnosis Code(s):     --- Professional ---                        Z12.11, Encounter for screening for malignant neoplasm                         of colon                        D12.4, Benign neoplasm of descending colon CPT copyright 2022 American Medical Association. All rights reserved. The codes documented in this report are preliminary and upon coder review may  be revised to meet current compliance requirements. [name] MD, MD 04/17/2024 8:48:47 AM This report has been signed electronically. Number of Addenda: 0 Note Initiated On: 04/17/2024 8:20 AM Scope Withdrawal Time: 0 hours 10 minutes 23 seconds  Total Procedure Duration: 0 hours 13 minutes 24 seconds  Estimated Blood Loss:  Estimated blood loss: none.      [facility]

## 2024-04-17 NOTE — Anesthesia Preprocedure Evaluation (Signed)
 Anesthesia Evaluation  Patient identified by MRN, date of birth, ID band Patient awake    Reviewed: Allergy & Precautions, H&P , NPO status , Patient's Chart, lab work & pertinent test results, reviewed documented beta blocker date and time   Airway Mallampati: II   Neck ROM: full    Dental  (+) Poor Dentition   Pulmonary neg pulmonary ROS   Pulmonary exam normal        Cardiovascular Exercise Tolerance: Good hypertension, On Medications Normal cardiovascular exam+ dysrhythmias  Rhythm:regular Rate:Normal     Neuro/Psych  Headaches PSYCHIATRIC DISORDERS         GI/Hepatic negative GI ROS, Neg liver ROS,,,  Endo/Other  negative endocrine ROS    Renal/GU negative Renal ROS  negative genitourinary   Musculoskeletal   Abdominal   Peds  Hematology negative hematology ROS (+)   Anesthesia Other Findings Past Medical History: No date: History of swelling of feet No date: Hypertension No date: Migraine No date: Seasonal allergies No date: UTI (lower urinary tract infection) Past Surgical History: No date: WISDOM TOOTH EXTRACTION BMI    Body Mass Index: 38.62 kg/m     Reproductive/Obstetrics negative OB ROS                              Anesthesia Physical Anesthesia Plan  ASA: 2  Anesthesia Plan: General   Post-op Pain Management:    Induction:   PONV Risk Score and Plan:   Airway Management Planned:   Additional Equipment:   Intra-op Plan:   Post-operative Plan:   Informed Consent: I have reviewed the patients History and Physical, chart, labs and discussed the procedure including the risks, benefits and alternatives for the proposed anesthesia with the patient or authorized representative who has indicated his/her understanding and acceptance.     Dental Advisory Given  Plan Discussed with: CRNA  Anesthesia Plan Comments:         Anesthesia Quick  Evaluation

## 2024-04-17 NOTE — Anesthesia Postprocedure Evaluation (Signed)
 Anesthesia Post Note  Patient: Kirsten Poole  Procedure(s) Performed: COLONOSCOPY POLYPECTOMY, INTESTINE  Patient location during evaluation: PACU Anesthesia Type: General Level of consciousness: awake and alert Pain management: pain level controlled Vital Signs Assessment: post-procedure vital signs reviewed and stable Respiratory status: spontaneous breathing, nonlabored ventilation, respiratory function stable and patient connected to nasal cannula oxygen Cardiovascular status: blood pressure returned to baseline and stable Postop Assessment: no apparent nausea or vomiting Anesthetic complications: no   No notable events documented.   Last Vitals:  Vitals:   04/17/24 0901 04/17/24 0911  BP: 96/71 106/75  Pulse: 60 64  Resp: 12 (!) 25  Temp:    SpO2: 97% 96%    Last Pain:  Vitals:   04/17/24 0911  TempSrc:   PainSc: 0-No pain                 Lynwood KANDICE Clause

## 2024-04-18 ENCOUNTER — Ambulatory Visit: Payer: Self-pay | Admitting: Gastroenterology

## 2024-04-18 LAB — SURGICAL PATHOLOGY

## 2024-04-24 ENCOUNTER — Other Ambulatory Visit: Payer: Self-pay | Admitting: Nurse Practitioner

## 2024-05-07 ENCOUNTER — Ambulatory Visit: Admitting: Family Medicine

## 2024-05-11 ENCOUNTER — Other Ambulatory Visit (INDEPENDENT_AMBULATORY_CARE_PROVIDER_SITE_OTHER): Payer: Self-pay | Admitting: Family Medicine

## 2024-05-11 DIAGNOSIS — E559 Vitamin D deficiency, unspecified: Secondary | ICD-10-CM

## 2024-06-12 ENCOUNTER — Ambulatory Visit: Admitting: Family Medicine

## 2024-06-12 VITALS — BP 125/84 | HR 69 | Ht 63.0 in | Wt 219.0 lb

## 2024-06-12 DIAGNOSIS — R7303 Prediabetes: Secondary | ICD-10-CM

## 2024-06-12 DIAGNOSIS — E559 Vitamin D deficiency, unspecified: Secondary | ICD-10-CM | POA: Diagnosis not present

## 2024-06-12 DIAGNOSIS — E66812 Obesity, class 2: Secondary | ICD-10-CM | POA: Diagnosis not present

## 2024-06-12 DIAGNOSIS — E88819 Insulin resistance, unspecified: Secondary | ICD-10-CM | POA: Diagnosis not present

## 2024-06-12 DIAGNOSIS — Z6838 Body mass index (BMI) 38.0-38.9, adult: Secondary | ICD-10-CM | POA: Diagnosis not present

## 2024-06-12 NOTE — Progress Notes (Signed)
 Office: [phone]  /  Fax: [phone]  WEIGHT SUMMARY AND BIOMETRICS  Starting Date: 02/23/23  Starting Weight: 232lb   Weight Lost Since Last Visit: 0lb   Vitals BP: 125/84 Pulse Rate: 69 SpO2: 99 %   Body Composition  Body Fat %: 103.4 % Fat Mass (lbs): 109.8 lbs Muscle Mass (lbs): 86.8 lbs Total Body Water (lbs): 86.8 lbs Visceral Fat Rating : 13    HPI  Chief Complaint: OBESITY  Kirsten Poole is here to discuss her progress with her obesity treatment plan. She is on the the Category 2 Plan and states she is following her eating plan approximately 65 % of the time. She states she is exercising 30-60 minutes 4 times per week.  Interval History:  Since last office visit she is down 0 lb This gives her a net weight loss of 13 lb in 15 mos of medically supervised weight management This is a 5.6% TBW loss with diet and exercise changes alone She is mindful of food choices She is getting in more fiber with her meals She has been craving more starches and comfort foods She has always struggled with emotional eating She is walking w/ her husband ~3 days/ wk and has enjoyed some longer hikes  Pharmacotherapy: none (she declined)  PHYSICAL EXAM:  Blood pressure 125/84, pulse 69, height 5' 3 (1.6 m), weight 219 lb (99.3 kg), SpO2 99%. Body mass index is 38.79 kg/m.  General: She is overweight, cooperative, alert, well developed, and in no acute distress. PSYCH: Has normal mood, affect and thought process.   Lungs: Normal breathing effort, no conversational dyspnea.   ASSESSMENT AND PLAN  TREATMENT PLAN FOR OBESITY:  Recommended Dietary Goals  Kirsten Poole is currently in the action stage of change. As such, her goal is to continue weight management plan with the following dietary changes: She has agreed to use the Chat GPT app to create a new meal plan' 1500 calorie LOW CARB meal plan with 2 meals and 2 snacks daily' She will keep her eating window 10 am - 8  pm  Behavioral Intervention  We discussed the following Behavioral Modification Strategies today: increasing lean protein intake to established goals, increasing vegetables, increasing water intake , keeping healthy foods at home, practice mindfulness eating and understand the difference between hunger signals and cravings, work on managing stress, creating time for self-care and relaxation, avoiding temptations and identifying enticing environmental cues, continue to practice mindfulness when eating, and planning for success.  Additional resources provided today: NA  Recommended Physical Activity Goals  Kirsten Poole has been advised to work up to 150 minutes of moderate intensity aerobic activity a week and strengthening exercises 2-3 times per week for cardiovascular health, weight loss maintenance and preservation of muscle mass.   She has agreed to Increase the intensity, frequency or duration of aerobic exercises   Reviewed exercise change goals on AVS  Pharmacotherapy changes for the treatment of obesity: none  ASSOCIATED CONDITIONS ADDRESSED TODAY  Insulin  resistance Last fasting insulin  elevated at 16.1 on 6/23.  This was an improvement from her previous reading.  She has declined use of metformin.  She has been more consistent with walking only set a goal for at least 30 minutes 3+ days a week.  She has yet to add in resistance training.  She is working on reducing carb and sugar intake with some room for improvement.  Will change her over to a low-carb meal plan.  Obesity, Class II, BMI 35-39.9, no comorbidity She  has previously declined use of antiobesity medication. Consider future use of Qsymia or metformin if needed  BMI 38.0-38.9,adult  Vitamin D  deficiency Last vitamin D  Lab Results  Component Value Date   VD25OH 33.6 03/05/2024   Improved.  She is reminded to take vitamin D  2000 IU once daily for maintenance.  Plan to recheck level in the next 1 to 2  months  Prediabetes Lab Results  Component Value Date   HGBA1C 5.6 03/05/2024  Resolved with dietary change, increased walking and a 5% TBW loss      She was informed of the importance of frequent follow up visits to maximize her success with intensive lifestyle modifications for her multiple health conditions.   ATTESTASTION STATEMENTS:  Reviewed by clinician on day of visit: allergies, medications, problem list, medical history, surgical history, family history, social history, and previous encounter notes pertinent to obesity diagnosis.   I have personally spent 30 minutes total time today in preparation, patient care, nutritional counseling and education,  and documentation for this visit, including the following: review of most recent clinical lab tests, prescribing medications/ refilling medications, reviewing medical assistant documentation, review and interpretation of bioimpedence results.     Kirsten Poole, D.O. DABFM, DABOM [facility] [address] [place], [place] [address] [phone]

## 2024-06-12 NOTE — Patient Instructions (Addendum)
 Use the Chat GPT app to create a new meal plan: 1500 calorie low carb meal plan with 2 meals and 2 snacks per day Keep eating schedule: 10:30 am breakfast, 2:00 snack, Dinner 5:00, 8 pm snack  Set a walking goal for 30+ min 3+ days/ wk  Take OTC vitamin D  gummies 2,000 international units  daily  No chips No bread No sweets No rice No cereal No crackers No potato  Sugar free drinks OK

## 2024-07-11 ENCOUNTER — Encounter: Payer: Self-pay | Admitting: Family Medicine

## 2024-07-11 ENCOUNTER — Ambulatory Visit: Admitting: Family Medicine

## 2024-07-11 VITALS — BP 126/87 | HR 73 | Temp 98.0°F | Ht 63.0 in | Wt 220.0 lb

## 2024-07-11 DIAGNOSIS — E66812 Obesity, class 2: Secondary | ICD-10-CM

## 2024-07-11 DIAGNOSIS — E88819 Insulin resistance, unspecified: Secondary | ICD-10-CM | POA: Diagnosis not present

## 2024-07-11 DIAGNOSIS — R632 Polyphagia: Secondary | ICD-10-CM | POA: Diagnosis not present

## 2024-07-11 DIAGNOSIS — F5089 Other specified eating disorder: Secondary | ICD-10-CM

## 2024-07-11 DIAGNOSIS — Z6838 Body mass index (BMI) 38.0-38.9, adult: Secondary | ICD-10-CM

## 2024-07-11 MED ORDER — METFORMIN HCL ER 500 MG PO TB24: 500.0000 mg | ORAL_TABLET | Freq: Every day | ORAL | 0 refills | Status: DC

## 2024-07-11 NOTE — Patient Instructions (Addendum)
 Change AI created meal plan to 1500 calorie healthy diet  Daily protein goal : 20-30 grams per MEAL  Work on water intake OK to add in sugar free packets  Begin Meformin XR 500 mg daily with dinner for insulin  resistance Begin wearing smartwatch daily

## 2024-07-11 NOTE — Progress Notes (Signed)
 Office: [phone]  /  Fax: [phone]  WEIGHT SUMMARY AND BIOMETRICS  Starting Date: 02/23/23  Starting Weight: 232lb   Weight Lost Since Last Visit: 0lb   Vitals Temp: 98 F (36.7 C) BP: 126/87 Pulse Rate: 73 SpO2: 98 %   Body Composition  Body Fat %: 46.7 % Fat Mass (lbs): 103 lbs Muscle Mass (lbs): 111.4 lbs Total Body Water (lbs): 84.8 lbs Visceral Fat Rating : 13     HPI  Chief Complaint: OBESITY  Kirsten Poole is here to discuss her progress with her obesity treatment plan. She is on the following a lower carbohydrate, vegetable and lean protein rich diet plan and states she is following her eating plan approximately 50 % of the time. She states she is exercising 30-60 minutes 3-5 times per week.   Interval History:  Since last office visit she is up 1 lb This gives her a net weight loss of 12 pounds and 16 months of medically supervised weight management This is a 5% total body weight loss She did change from cat 2 meal plan to AI created meal plan: 1500 kcal using IF She has had more hunger in the morning which leads to overeating in the afternoon She is craving more salty foods She is doing a kettlebell workout from home 3 x a week She did some walking and hiking w/ her husband  She has declined new start antiobesity medication or use of metformin Her husband is also working on weight loss  Pharmacotherapy: None  PHYSICAL EXAM:  Blood pressure 126/87, pulse 73, temperature 98 F (36.7 C), height 5' 3 (1.6 m), weight 220 lb (99.8 kg), SpO2 98%. Body mass index is 38.97 kg/m.  General: She is overweight, cooperative, alert, well developed, and in no acute distress. PSYCH: Has normal mood, affect and thought process.   Lungs: Normal breathing effort, no conversational dyspnea.   ASSESSMENT AND PLAN  TREATMENT PLAN FOR OBESITY:  Recommended Dietary Goals  Kirsten Poole is currently in the action stage of change. As such, her goal is to continue weight  management plan. She has agreed to discontinuing intermittent fasting  Continue AI created 1500-calorie healthy meal plan    We discussed the following Behavioral Modification Strategies today: increasing lean protein intake to established goals, increasing fiber rich foods, avoiding skipping meals, increasing water intake , work on meal planning and preparation, reading food labels , keeping healthy foods at home, decreasing eating out or consumption of processed foods, and making healthy choices when eating convenient foods, practice mindfulness eating and understand the difference between hunger signals and cravings, work on managing stress, creating time for self-care and relaxation, avoiding temptations and identifying enticing environmental cues, continue to practice mindfulness when eating, and planning for success.  Additional resources provided today: NA  Recommended Physical Activity Goals  Kirsten Poole has been advised to work up to 150 minutes of moderate intensity aerobic activity a week and strengthening exercises 2-3 times per week for cardiovascular health, weight loss maintenance and preservation of muscle mass.   She has agreed to Increase the intensity, frequency or duration of aerobic exercises   Increase walking time to 30 to 45 minutes 5 days a week Try tracking daily steps with a smart watch for daily accountability  Pharmacotherapy changes for the treatment of obesity: Begin metformin XR 500 mg once daily with dinner  ASSOCIATED CONDITIONS ADDRESSED TODAY  Insulin  resistance Last fasting insulin  elevated at 16.1 on 03/05/2024 This is down from previous at 28.1 Continue  to work on reducing added sugar and starch intake Great job starting with more consistent walking and continue to build on this Begin metformin XR 500 mg once daily with dinner Discussed common adverse side effects.  Recheck chemistry panel next visit -     metFORMIN HCl ER; Take 1 tablet (500 mg total) by  mouth daily with supper.  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 Sharron On stress reduction, self-care, mindful eating and regular exercise to help with mood  Polyphagia Worsened with use of intermittent fasting Discontinue intermittent fasting, going back to 3 meals a day with a focus on lean protein and fiber with meals Discontinue high sugar foods and drinks which stimulate appetite Avoid ultra processed foods and frequent meals out Consider the addition of Qsymia or Contrave if needed  She was informed of the importance of frequent follow up visits to maximize her success with intensive lifestyle modifications for her multiple health conditions.   ATTESTASTION STATEMENTS:  Reviewed by clinician on day of visit: allergies, medications, problem list, medical history, surgical history, family history, social history, and previous encounter notes pertinent to obesity diagnosis.   I have personally spent 30 minutes total time today in preparation, patient care, nutritional counseling and education,  and documentation for this visit, including the following: review of most recent clinical lab tests, prescribing medications/ refilling medications, reviewing medical assistant documentation, review and interpretation of bioimpedence results.     Darice Haddock, D.O. DABFM, DABOM [facility] [address] [place], [place] [address] [phone]

## 2024-08-03 ENCOUNTER — Other Ambulatory Visit: Payer: Self-pay | Admitting: Family Medicine

## 2024-08-03 DIAGNOSIS — E88819 Insulin resistance, unspecified: Secondary | ICD-10-CM

## 2024-08-15 ENCOUNTER — Ambulatory Visit: Admitting: Family Medicine

## 2024-08-15 ENCOUNTER — Encounter: Payer: Self-pay | Admitting: Family Medicine

## 2024-08-15 VITALS — BP 122/86 | HR 85 | Temp 98.2°F | Ht 63.0 in | Wt 219.0 lb

## 2024-08-15 DIAGNOSIS — R7303 Prediabetes: Secondary | ICD-10-CM

## 2024-08-15 DIAGNOSIS — Z6838 Body mass index (BMI) 38.0-38.9, adult: Secondary | ICD-10-CM

## 2024-08-15 DIAGNOSIS — E88819 Insulin resistance, unspecified: Secondary | ICD-10-CM

## 2024-08-15 MED ORDER — METFORMIN HCL ER 500 MG PO TB24: 500.0000 mg | ORAL_TABLET | Freq: Every day | ORAL | 1 refills | Status: AC

## 2024-08-15 NOTE — Progress Notes (Signed)
 Office: [phone]  /  Fax: [phone]  WEIGHT SUMMARY AND BIOMETRICS  Starting Date: 02/23/23  Starting Weight: 232lb   Weight Lost Since Last Visit: 1lb   Vitals Temp: 98.2 F (36.8 C) BP: 122/86 Pulse Rate: 85 SpO2: 97 %   Body Composition  Body Fat %: 47.6 % Fat Mass (lbs): 104.4 lbs Muscle Mass (lbs): 109.2 lbs Total Body Water (lbs): 85 lbs Visceral Fat Rating : 13     HPI  Chief Complaint: OBESITY  Kirsten Poole is here to discuss her progress with her obesity treatment plan. She is on the following a lower carbohydrate, vegetable and lean protein rich diet plan and states she is following her eating plan approximately 60 % of the time. She states she is exercising 30 minutes 1-2 times per week.   Interval History:  Since last office visit she is down 1 lb She took Metformin  XR 500 mg daily for 2 weeks and then her dad had an AMI and she forget to take it She did have some improvements in satiety and cravings with it Denies adverse SE She has some home exercise equipment and do some hiking on the weekend She is mindful of portion sizes and snacks She has an net weight loss of 13 lb in the past 15 mos of medically supervised weight management This is a 5.6% TBW loss  Pharmacotherapy: metformin  XR 500 mg daily  PHYSICAL EXAM:  Blood pressure 122/86, pulse 85, temperature 98.2 F (36.8 C), height 5' 3 (1.6 m), weight 219 lb (99.3 kg), SpO2 97%. Body mass index is 38.79 kg/m.  General: She is overweight, cooperative, alert, well developed, and in no acute distress. PSYCH: Has normal mood, affect and thought process.   Lungs: Normal breathing effort, no conversational dyspnea.  ASSESSMENT AND PLAN  TREATMENT PLAN FOR OBESITY:  Recommended Dietary Goals  Kirsten Poole is currently in the action stage of change. As such, her goal is to continue weight management plan. She has agreed to keeping a food journal and adhering to recommended goals of 1500 calories  and 90 g of protein. Can use AI created 1500 calorie low carb meal plan  Behavioral Intervention  We discussed the following Behavioral Modification Strategies today: increasing lean protein intake to established goals, increasing fiber rich foods, increasing water intake , work on meal planning and preparation, keeping healthy foods at home, decreasing eating out or consumption of processed foods, and making healthy choices when eating convenient foods, continue to practice mindfulness when eating, and celebration eating strategies.  Additional resources provided today: NA  Recommended Physical Activity Goals  Kirsten Poole has been advised to work up to 150 minutes of moderate intensity aerobic activity a week and strengthening exercises 2-3 times per week for cardiovascular health, weight loss maintenance and preservation of muscle mass.   She has agreed to Increase the intensity, frequency or duration of aerobic exercises    Pharmacotherapy changes for the treatment of obesity: none  ASSOCIATED CONDITIONS ADDRESSED TODAY  Insulin  resistance Last fasting insulin  elevated at 16.1, down from 28.1 Doing well with Metformin  but has not taken consistently Continue to work on a low sugar/ lower carb diet rich in lean protein and fiber while increasing daily walking time Repeat BMP, B12, Ins in 1-2 mos  -     metFORMIN  HCl ER; Take 1 tablet (500 mg total) by mouth daily with supper.  Dispense: 30 tablet; Refill: 1  Morbid obesity (HCC)  BMI 38.0-38.9,adult  Prediabetes Lab Results  Component  Value Date   HGBA1C 5.6 03/05/2024   resolved     She was informed of the importance of frequent follow up visits to maximize her success with intensive lifestyle modifications for her multiple health conditions.   ATTESTASTION STATEMENTS:  Reviewed by clinician on day of visit: allergies, medications, problem list, medical history, surgical history, family history, social history, and previous  encounter notes pertinent to obesity diagnosis.   I have personally spent 20 minutes total time today in preparation, patient care, nutritional counseling and education,  and documentation for this visit, including the following: review of most recent clinical lab tests, prescribing medications/ refilling medications, reviewing medical assistant documentation, review and interpretation of bioimpedence results.     Kirsten Poole, D.O. DABFM, DABOM [facility] [address] [place], [place] [address] [phone]

## 2024-09-26 ENCOUNTER — Ambulatory Visit: Admitting: Family Medicine

## 2024-09-26 ENCOUNTER — Encounter: Payer: Self-pay | Admitting: Family Medicine

## 2024-09-26 VITALS — BP 137/86 | HR 79 | Ht 63.0 in | Wt 215.0 lb

## 2024-09-26 DIAGNOSIS — E559 Vitamin D deficiency, unspecified: Secondary | ICD-10-CM | POA: Diagnosis not present

## 2024-09-26 DIAGNOSIS — E66812 Obesity, class 2: Secondary | ICD-10-CM

## 2024-09-26 DIAGNOSIS — R7303 Prediabetes: Secondary | ICD-10-CM | POA: Diagnosis not present

## 2024-09-26 DIAGNOSIS — Z6838 Body mass index (BMI) 38.0-38.9, adult: Secondary | ICD-10-CM | POA: Diagnosis not present

## 2024-09-26 DIAGNOSIS — E88819 Insulin resistance, unspecified: Secondary | ICD-10-CM

## 2024-09-26 NOTE — Patient Instructions (Signed)
 Finish out RX vitamin D  once a week then resume daily vitamin D  2,000 international units  daily  (gummies or pills are fine)  Remember to take Metformin  with dinner daily  Home workout goal : 15 min 3-4 x week Long walks/ hikes on the weekend

## 2024-09-26 NOTE — Progress Notes (Signed)
 "  Office: [phone]  /  Fax: [phone]  WEIGHT SUMMARY AND BIOMETRICS  Starting Date: 02/23/23  Starting Weight: 232lb   Weight Lost Since Last Visit: 4lb   Vitals BP: 137/86 Pulse Rate: 79 SpO2: 100 %   Body Composition  Body Fat %: 46.9 % Fat Mass (lbs): 101 lbs Muscle Mass (lbs): 108.6 lbs Total Body Water (lbs): 83.6 lbs Visceral Fat Rating : 13     HPI  Chief Complaint: OBESITY  Kirsten Poole is here to discuss her progress with her obesity treatment plan. Kirsten Poole is on the following a lower carbohydrate, vegetable and lean protein rich diet plan and states Kirsten Poole is following her eating plan approximately 60 % of the time. Kirsten Poole states Kirsten Poole is exercising 30 minutes 1-2 times per week.   Interval History:  Since last office visit Kirsten Poole is down 4 lb Kirsten Poole was sick between visits Kirsten Poole did pause Metformin  while sick and has forget to take it some days Kirsten Poole has noticed a reduction in sugar cravings Kirsten Poole has incorporated intermittent fasting, delaying breakfast  Kirsten Poole has improved water intake Hunger and cravings under good control This gives her a net 17 lb weight loss in the past 16 mos of medically supervised weight management This is a 7.3% total body weight loss Kirsten Poole has been doing 15-minute kettle bell workouts at home 2-3 times a week and long hikes most weekends.  Pharmacotherapy: Metformin  XR 500 mg once daily for insulin  resistance  PHYSICAL EXAM:  Blood pressure 137/86, pulse 79, height 5' 3 (1.6 m), weight 215 lb (97.5 kg), SpO2 100%. Body mass index is 38.09 kg/m.  General: Kirsten Poole is overweight, cooperative, alert, well developed, and in no acute distress. PSYCH: Has normal mood, affect and thought process.   Lungs: Normal breathing effort, no conversational dyspnea.   ASSESSMENT AND PLAN  TREATMENT PLAN FOR OBESITY:  Recommended Dietary Goals  Kirsten Poole is currently in the action stage of change. As such, her goal is to continue weight management plan. Kirsten Poole has  agreed to practicing portion control and making smarter food choices, such as increasing vegetables and decreasing simple carbohydrates. Continue 16: 8 intermittent fasting Keep daily calorie intake around 1500/day  Behavioral Intervention  We discussed the following Behavioral Modification Strategies today: increasing lean protein intake to established goals, increasing fiber rich foods, increasing water intake , keeping healthy foods at home, continue to practice mindfulness when eating, and planning for success.  Additional resources provided today: NA  Recommended Physical Activity Goals  Kirsten Poole has been advised to work up to 150 minutes of moderate intensity aerobic activity a week and strengthening exercises 2-3 times per week for cardiovascular health, weight loss maintenance and preservation of muscle mass.   Kirsten Poole has agreed to Increase the intensity, frequency or duration of strengthening exercises  and Increase the intensity, frequency or duration of aerobic exercises    Pharmacotherapy changes for the treatment of obesity: None  ASSOCIATED CONDITIONS ADDRESSED TODAY  Insulin  resistance Improving.  Last fasting insulin  was 16.1, down from 28.1 obtained 03/05/2024.  Kirsten Poole is tolerating metformin  XR 500 mg once daily with food well but does forget to take it at times.  Carb and sugar cravings have reduced.  Kirsten Poole has also incorporated 16: 8 intermittent fasting for the treatment of insulin  resistance and increase both cardio and resistance training.  Repeat labs next visit to include fasting insulin , BMP, B12.  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  Repeat A1c next visit  Vitamin D  deficiency Improved.  Kirsten Poole has completed vitamin D  50,000 IU once weekly and will switch over to OTC vitamin D  2000 IU once daily and repeat lab next  visit     Kirsten Poole was informed of the importance of frequent follow up visits to maximize her success with intensive lifestyle modifications for her multiple health conditions.   ATTESTASTION STATEMENTS:  Reviewed by clinician on day of visit: allergies, medications, problem list, medical history, surgical history, family history, social history, and previous encounter notes pertinent to obesity diagnosis.   I have personally spent 30 minutes total time today in preparation, patient care, nutritional counseling and education,  and documentation for this visit, including the following: review of most recent clinical lab tests, prescribing medications/ refilling medications, reviewing medical assistant documentation, review and interpretation of bioimpedence results.     Darice Haddock, D.O. DABFM, DABOM [facility] [address] [place], [place] [address] [phone] "

## 2024-11-08 ENCOUNTER — Ambulatory Visit: Admitting: Family Medicine
